# Patient Record
Sex: Female | Born: 1970 | Hispanic: Yes
Health system: Southern US, Academic
[De-identification: ages and names within clinical notes are randomized; demographics above are authoritative.]

## PROBLEM LIST (undated history)

## (undated) ENCOUNTER — Inpatient Hospital Stay (HOSPITAL_COMMUNITY): Payer: Medicaid Other

## (undated) ENCOUNTER — Encounter

## (undated) ENCOUNTER — Ambulatory Visit
Attending: Student in an Organized Health Care Education/Training Program | Primary: Student in an Organized Health Care Education/Training Program

## (undated) ENCOUNTER — Ambulatory Visit
Attending: Rehabilitative and Restorative Service Providers" | Primary: Rehabilitative and Restorative Service Providers"

## (undated) ENCOUNTER — Ambulatory Visit

## (undated) ENCOUNTER — Telehealth

## (undated) ENCOUNTER — Encounter
Attending: Student in an Organized Health Care Education/Training Program | Primary: Student in an Organized Health Care Education/Training Program

## (undated) ENCOUNTER — Ambulatory Visit: Attending: Rheumatology | Primary: Rheumatology

## (undated) ENCOUNTER — Encounter: Attending: Medical | Primary: Medical

## (undated) ENCOUNTER — Encounter: Attending: Ambulatory Care | Primary: Ambulatory Care

## (undated) ENCOUNTER — Encounter: Attending: Transplant Hepatology | Primary: Transplant Hepatology

## (undated) ENCOUNTER — Encounter: Attending: Rheumatology | Primary: Rheumatology

## (undated) ENCOUNTER — Ambulatory Visit: Attending: Retina Specialist | Primary: Retina Specialist

## (undated) ENCOUNTER — Ambulatory Visit: Attending: Ambulatory Care | Primary: Ambulatory Care

## (undated) ENCOUNTER — Telehealth
Attending: Student in an Organized Health Care Education/Training Program | Primary: Student in an Organized Health Care Education/Training Program

## (undated) ENCOUNTER — Encounter: Attending: Internal Medicine | Primary: Internal Medicine

## (undated) ENCOUNTER — Encounter: Attending: Dermatology | Primary: Dermatology

## (undated) ENCOUNTER — Telehealth: Attending: Rheumatology | Primary: Rheumatology

## (undated) ENCOUNTER — Telehealth: Attending: Ambulatory Care | Primary: Ambulatory Care

## (undated) ENCOUNTER — Ambulatory Visit: Attending: Obstetrics & Gynecology | Primary: Obstetrics & Gynecology

## (undated) ENCOUNTER — Ambulatory Visit: Attending: Dermatology | Primary: Dermatology

## (undated) DIAGNOSIS — F329 Major depressive disorder, single episode, unspecified: Secondary | ICD-10-CM

## (undated) DIAGNOSIS — A549 Gonococcal infection, unspecified: Secondary | ICD-10-CM

## (undated) DIAGNOSIS — N200 Calculus of kidney: Secondary | ICD-10-CM

## (undated) DIAGNOSIS — R002 Palpitations: Secondary | ICD-10-CM

## (undated) DIAGNOSIS — J45909 Unspecified asthma, uncomplicated: Secondary | ICD-10-CM

## (undated) DIAGNOSIS — L940 Localized scleroderma [morphea]: Secondary | ICD-10-CM

## (undated) DIAGNOSIS — D649 Anemia, unspecified: Secondary | ICD-10-CM

## (undated) DIAGNOSIS — F32A Depression, unspecified: Secondary | ICD-10-CM

## (undated) DIAGNOSIS — K649 Unspecified hemorrhoids: Secondary | ICD-10-CM

## (undated) DIAGNOSIS — B019 Varicella without complication: Secondary | ICD-10-CM

## (undated) DIAGNOSIS — R0789 Other chest pain: Secondary | ICD-10-CM

## (undated) HISTORY — DX: Palpitations: R00.2

## (undated) HISTORY — DX: Depression, unspecified: F32.A

## (undated) HISTORY — DX: Anemia, unspecified: D64.9

## (undated) HISTORY — DX: Other chest pain: R07.89

## (undated) HISTORY — DX: Varicella without complication: B01.9

## (undated) HISTORY — DX: Localized scleroderma (morphea): L94.0

## (undated) HISTORY — DX: Unspecified asthma, uncomplicated: J45.909

## (undated) HISTORY — DX: Major depressive disorder, single episode, unspecified: F32.9

## (undated) HISTORY — DX: Calculus of kidney: N20.0

## (undated) HISTORY — DX: Unspecified hemorrhoids: K64.9

## (undated) HISTORY — DX: Gonococcal infection, unspecified: A54.9

## (undated) MED ORDER — IBUPROFEN 800 MG TABLET: Freq: Three times a day (TID) | ORAL | 0.00000 days

---
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
## 2000-02-29 ENCOUNTER — Emergency Department (HOSPITAL_COMMUNITY): Admission: EM | Admit: 2000-02-29 | Discharge: 2000-02-29 | Payer: Self-pay | Admitting: *Deleted

## 2001-03-20 ENCOUNTER — Emergency Department (HOSPITAL_COMMUNITY): Admission: EM | Admit: 2001-03-20 | Discharge: 2001-03-20 | Payer: Self-pay | Admitting: Emergency Medicine

## 2003-03-12 ENCOUNTER — Encounter: Admission: RE | Admit: 2003-03-12 | Discharge: 2003-03-12 | Payer: Self-pay | Admitting: Obstetrics and Gynecology

## 2005-05-19 ENCOUNTER — Encounter (INDEPENDENT_AMBULATORY_CARE_PROVIDER_SITE_OTHER): Payer: Self-pay | Admitting: Specialist

## 2005-05-19 ENCOUNTER — Ambulatory Visit (HOSPITAL_COMMUNITY): Admission: RE | Admit: 2005-05-19 | Discharge: 2005-05-19 | Payer: Self-pay | Admitting: Obstetrics and Gynecology

## 2008-01-28 ENCOUNTER — Other Ambulatory Visit: Admission: RE | Admit: 2008-01-28 | Discharge: 2008-01-28 | Payer: Self-pay | Admitting: Gynecology

## 2010-07-10 ENCOUNTER — Encounter: Payer: Self-pay | Admitting: Family Medicine

## 2010-10-20 ENCOUNTER — Inpatient Hospital Stay (INDEPENDENT_AMBULATORY_CARE_PROVIDER_SITE_OTHER)
Admission: RE | Admit: 2010-10-20 | Discharge: 2010-10-20 | Disposition: A | Discharge status: Home / Self Care | Payer: Self-pay | Source: Ambulatory Visit | Attending: Emergency Medicine | Admitting: Emergency Medicine

## 2010-10-20 DIAGNOSIS — N920 Excessive and frequent menstruation with regular cycle: Secondary | ICD-10-CM

## 2010-10-20 LAB — POCT URINALYSIS DIP (DEVICE)
Glucose, UA: NEGATIVE mg/dL
Nitrite: NEGATIVE
Protein, ur: NEGATIVE mg/dL
Urobilinogen, UA: 0.2 mg/dL (ref 0.0–1.0)

## 2010-10-20 LAB — POCT PREGNANCY, URINE: Preg Test, Ur: NEGATIVE

## 2010-10-21 LAB — POCT I-STAT, CHEM 8
Calcium, Ion: 1.2 mmol/L (ref 1.12–1.32)
Creatinine, Ser: 0.8 mg/dL (ref 0.4–1.2)
Hemoglobin: 12.2 g/dL (ref 12.0–15.0)
Sodium: 141 mEq/L (ref 135–145)
TCO2: 27 mmol/L (ref 0–100)

## 2010-11-04 NOTE — H&P (Signed)
NAME:  Beverly Howe, Beverly Howe NO.:  [id number]   MEDICAL RECORD NO.:  [id number]          PATIENT TYPE:  AMB   LOCATION:  SDC                           FACILITY:  [facility]   PHYSICIAN:  Juluis Mire, M.D.   DATE OF BIRTH:  1970-12-27   DATE OF ADMISSION:  DATE OF DISCHARGE:                                HISTORY & PHYSICAL   A 40 year old gravida 1 para 1 married female presents for a hysteroscopy.   PRESENT ADMISSION:  The patient has a longstanding history of anovulatory  cycling and underwent a saline infusion ultrasound in September with the  finding of several large polypoid masses within the endometrial cavity. In  view of this, the patient now presents for hysteroscopic evaluation and  resection of endometrial polyps.   ALLERGIES:  She has no known drug allergies.   MEDICATIONS:  None.   PAST MEDICAL HISTORY:  Usual childhood diseases, no significant sequelae.   PAST SURGICAL HISTORY:  One prior cesarean section.   FAMILY HISTORY:  Noncontributory.   SOCIAL HISTORY:  Reveals no tobacco or alcohol use.   REVIEW OF SYSTEMS:  Noncontributory.   PHYSICAL EXAMINATION:  VITAL SIGNS:  The patient is afebrile with stable  vital signs.  HEENT:  The patient is normocephalic. Pupils equal, round, and reactive to  light and accommodation. Extraocular movements were intact. Sclerae and  conjunctivae clear, oropharynx clear.  NECK:  Without thyromegaly.  BREASTS:  No discrete masses.  LUNGS:  Clear.  CARDIOVASCULAR:  Regular rhythm and rate, no murmurs or gallops.  ABDOMEN:  Benign.  PELVIC:  Normal external genitalia, vaginal mucosa is clear. Cervix  unremarkable. Uterus normal size, shape, and contour. Adnexa free of masses  or tenderness.  EXTREMITIES:  Trace edema.  NEUROLOGIC:  Grossly within normal limits.   IMPRESSION:  Anovulatory bleeding with endometrial polyp.   PLAN:  The patient will undergo a hysteroscopic evaluation with resection of  polyp. The  risks of surgery have been discussed including the risks of  infection; the risk of hemorrhage that could require transfusion with the  risk of  AIDS or hepatitis; the risk of injury to adjacent organs including bladder,  bowel or ureters that could require further exploratory surgery; the risk of  deep venous thrombosis and pulmonary embolus. The patient professed an  understanding of the indications and risks.      Juluis Mire, M.D.  Electronically Signed     JSM/MEDQ  D:  05/19/2005  T:  05/19/2005  Job:  [id number]

## 2010-11-04 NOTE — H&P (Signed)
NAME:  Beverly Howe, Beverly Howe NO.:  [id number]   MEDICAL RECORD NO.:  [id number]          PATIENT TYPE:  AMB   LOCATION:  SDC                           [facility]   PHYSICIAN:  Juluis Mire, M.D.   DATE OF BIRTH:  10-14-1970   DATE OF ADMISSION:  04/07/2005  DATE OF DISCHARGE:                                HISTORY & PHYSICAL   A 40 year old gravida 1, para 1, female presents for hysteroscopy.   The patient presented to the office on September 15.  She has not had a  period for two years.  She had some lower abdominal discomfort.  Was  actually trying to conceive.  Subsequent saline infusion ultrasound revealed  significant endometrial build-up and polyps consistent with her anovulatory  status.  Ovaries were otherwise unremarkable.  She now presents for a  hysteroscopic evaluation of the endometrium and resection of polyps in order  to rule out any type of hyperplastic process.   ALLERGIES:  No known drug allergies.   MEDICATIONS:  None.   PAST MEDICAL HISTORY:  The usual childhood diseases, no significant  sequelae.   PREVIOUS SURGICAL HISTORY:  One cesarean section.   FAMILY HISTORY:  Noncontributory.   SOCIAL HISTORY:  No tobacco or alcohol use.   REVIEW OF SYSTEMS:  Noncontributory.   PHYSICAL EXAMINATION:  VITAL SIGNS:  The patient is afebrile with stable  vital signs.  HEENT:  Patient normocephalic.  Pupils equal, round, and reactive to light  and accommodation.  Extraocular movements were intact.  Sclerae and  conjunctivae were clear.  Oropharynx clear.  NECK:  Without thyromegaly.  BREASTS:  No discrete masses.  LUNGS:  Clear.  CARDIAC:  Regular rhythm and rate without murmurs.  ABDOMEN:  Benign.  No mass, organomegaly or tenderness.  PELVIC:  Normal external genitalia.  Vaginal mucosa is clear.  Cervix  unremarkable.  Uterus normal size, shape and contour.  Adnexa free of masses  or tenderness.  EXTREMITIES:  Trace edema.  NEUROLOGIC:   Grossly within normal limits.   IMPRESSION:  1.  Anovulatory cycling with associated oligo-amenorrhea.  2.  Endometrial polyp, rule out endometrial hyperplasia.   PLAN:  The patient will undergo hysteroscopic evaluation with resection of  polyps.  The risks of surgery have been explained through interpreter.  These risks include the risk of infection; risk of vascular injury that  could lead to hemorrhage requiring transfusion or possible hysterectomy;  risk of injury to adjacent organs through perforation that could require  exploratory surgery; the risk of deep venous thrombosis and pulmonary  embolus.  The patient expressed an understanding of the indications and  risks.      Juluis Mire, M.D.  Electronically Signed     JSM/MEDQ  D:  04/07/2005  T:  04/07/2005  Job:  [id number]

## 2010-11-04 NOTE — Op Note (Signed)
NAME:  Beverly Howe, Beverly Howe          ACCOUNT NO.:  [id number]   MEDICAL RECORD NO.:  [id number]          PATIENT TYPE:  AMB   LOCATION:  SDC                           [facility]   PHYSICIAN:  Juluis Mire, M.D.   DATE OF BIRTH:  13-Jun-1971   DATE OF PROCEDURE:  05/19/2005  DATE OF DISCHARGE:                                 OPERATIVE REPORT   PREOPERATIVE DIAGNOSIS:  Anovulatory bleeding with endometrial polyps.   POSTOPERATIVE DIAGNOSIS:  Anovulatory bleeding with endometrial polyps.   OPERATIVE PROCEDURE:  Cervical dilatation, hysteroscopy, resection of  polyps, endometrial curettings.   SURGEON:  Juluis Mire, M.D.   ANESTHESIA:  General.   ESTIMATED BLOOD LOSS:  Minimal.   PACKS AND DRAINS:  None.   INTRAOPERATIVE BLOOD REPLACED:  None.   COMPLICATIONS:  None.   INDICATIONS:  As dictated in the history and physical.   PROCEDURE:  The patient was taken into the OR, placed in the supine  position.  After a satisfactory level of general anesthesia obtained, the  patient was placed in the dorsal lithotomy position using the Allen  stirrups.  The perineum and vagina were cleansed out with Betadine and  draped as a sterile field.  A speculum was placed in the vaginal vault.  The  cervix was grasped with a single-tooth tenaculum.  The uterus sounded to 8  cm.  The cervix was serially dilated to a size 35 Pratt dilator.  Operative  hysteroscope was introduced , intrauterine cavity was distended using  sorbitol.  Visualization revealed three polyps, one on the right pelvic  sidewall and two on the posterior wall.  Each was resected, sent to  pathology for review.  Endometrial curettings were obtained.  There was no  sign of complications, no active bleeding or signs of perforation.  Total  deficit was 20 mL.  At this point in time the single-tooth tenaculum and  speculum were removed.  The patient was taken out of the dorsal lithotomy  position and once alert and  extubated transferred to the recovery room in  good condition.  Sponge, instrument and needle count reported as correct by  the circulating nurse x2.      Juluis Mire, M.D.  Electronically Signed    JSM/MEDQ  D:  05/19/2005  T:  05/19/2005  Job:  [id number]

## 2011-07-27 ENCOUNTER — Other Ambulatory Visit: Payer: Self-pay | Admitting: Family Medicine

## 2011-07-27 DIAGNOSIS — N6320 Unspecified lump in the left breast, unspecified quadrant: Secondary | ICD-10-CM

## 2011-08-07 ENCOUNTER — Ambulatory Visit: Payer: Self-pay

## 2011-08-22 ENCOUNTER — Ambulatory Visit (INDEPENDENT_AMBULATORY_CARE_PROVIDER_SITE_OTHER): Payer: Self-pay | Admitting: *Deleted

## 2011-08-22 VITALS — BP 112/80 | HR 74 | Temp 97.3°F | Ht <= 58 in | Wt 147.6 lb

## 2011-08-22 DIAGNOSIS — N6320 Unspecified lump in the left breast, unspecified quadrant: Secondary | ICD-10-CM

## 2011-08-22 DIAGNOSIS — Z1239 Encounter for other screening for malignant neoplasm of breast: Secondary | ICD-10-CM

## 2011-08-22 DIAGNOSIS — N63 Unspecified lump in unspecified breast: Secondary | ICD-10-CM

## 2011-08-22 NOTE — Patient Instructions (Addendum)
Taught patient how to perform BSE and gave educational materials to take home. Patient did not need a Pap smear today due to last Pap smear was 10/24/10. Let her know BCCCP will cover Pap smears every 3 years unless has a history of abnormal Pap smears. Patient is scheduled for a diagnostic mammogram and possible left breast ultrasound tomorrow Wednesday, August 23, 2011 at 0800. Patient aware of appointment and will be there. Let patient know will follow up with her within the next couple weeks with results. Patient verbalized understanding.

## 2011-08-22 NOTE — Progress Notes (Signed)
Complaints left breast lump and pain  Patient referred from the [facility] [employer]. [facility]ointment at [facility] was 07/27/11.  Pap Smear:    Pap smear not performed today. Patients last Pap smear was 10/24/10 at the [facility] and was normal. Per patient she has no history of abnormal Pap smears. No Pap smear results in EPIC.  Physical exam: Breasts Breasts symmetrical. No skin abnormalities bilateral breasts. No nipple retraction bilateral breasts. No nipple discharge bilateral breasts. No lymphadenopathy. No lumps palpated right breast. Palpated small lump in the left breast at 3 o'clock about 2 cm from the nipple. Patient complained of tenderness on palpation.  Patient referred to the [facility] Mammogram and left breast ultrasound Wednesday, August 23, 2011 at 0800.         Pelvic/Bimanual No Pap smear completed today since last Pap smear was 10/24/10 and normal. Pap smear not indicated per BCCCP guidelines.

## 2011-08-23 ENCOUNTER — Ambulatory Visit
Admission: RE | Admit: 2011-08-23 | Discharge: 2011-08-23 | Disposition: A | Discharge status: Home / Self Care | Payer: No Typology Code available for payment source | Source: Ambulatory Visit | Attending: Family Medicine | Admitting: Family Medicine

## 2011-08-23 DIAGNOSIS — N6320 Unspecified lump in the left breast, unspecified quadrant: Secondary | ICD-10-CM

## 2011-08-25 ENCOUNTER — Telehealth: Payer: Self-pay | Admitting: *Deleted

## 2011-08-25 NOTE — Telephone Encounter (Signed)
Telephoned patient at home # and advised pt diagnostic mammogram was normal. Advised pt would need screening mammogram at age 41. Pt voiced understanding.

## 2012-01-01 ENCOUNTER — Other Ambulatory Visit (HOSPITAL_COMMUNITY): Payer: Self-pay | Admitting: Family

## 2012-01-01 DIAGNOSIS — Z0489 Encounter for examination and observation for other specified reasons: Secondary | ICD-10-CM

## 2012-01-01 LAB — OB RESULTS CONSOLE ANTIBODY SCREEN: Antibody Screen: NEGATIVE

## 2012-01-01 LAB — OB RESULTS CONSOLE HEPATITIS B SURFACE ANTIGEN: Hepatitis B Surface Ag: NEGATIVE

## 2012-01-01 LAB — OB RESULTS CONSOLE RUBELLA ANTIBODY, IGM: Rubella: IMMUNE

## 2012-01-01 LAB — OB RESULTS CONSOLE RPR: RPR: NONREACTIVE

## 2012-01-01 LAB — OB RESULTS CONSOLE VARICELLA ZOSTER ANTIBODY, IGG: Varicella: IMMUNE

## 2012-01-11 ENCOUNTER — Other Ambulatory Visit (HOSPITAL_COMMUNITY): Payer: Self-pay | Admitting: Family

## 2012-01-11 ENCOUNTER — Ambulatory Visit (HOSPITAL_COMMUNITY)
Admission: RE | Admit: 2012-01-11 | Discharge: 2012-01-11 | Disposition: A | Discharge status: Home / Self Care | Payer: Medicaid Other | Source: Ambulatory Visit | Attending: Family | Admitting: Family

## 2012-01-11 DIAGNOSIS — Z363 Encounter for antenatal screening for malformations: Secondary | ICD-10-CM | POA: Insufficient documentation

## 2012-01-11 DIAGNOSIS — Z1389 Encounter for screening for other disorder: Secondary | ICD-10-CM | POA: Insufficient documentation

## 2012-01-11 DIAGNOSIS — O34219 Maternal care for unspecified type scar from previous cesarean delivery: Secondary | ICD-10-CM | POA: Insufficient documentation

## 2012-01-11 DIAGNOSIS — Z0489 Encounter for examination and observation for other specified reasons: Secondary | ICD-10-CM

## 2012-01-11 DIAGNOSIS — O09529 Supervision of elderly multigravida, unspecified trimester: Secondary | ICD-10-CM | POA: Insufficient documentation

## 2012-01-11 DIAGNOSIS — O358XX0 Maternal care for other (suspected) fetal abnormality and damage, not applicable or unspecified: Secondary | ICD-10-CM | POA: Insufficient documentation

## 2012-01-29 ENCOUNTER — Other Ambulatory Visit (HOSPITAL_COMMUNITY): Payer: Self-pay | Admitting: Physician Assistant

## 2012-01-29 DIAGNOSIS — Z3689 Encounter for other specified antenatal screening: Secondary | ICD-10-CM

## 2012-02-05 ENCOUNTER — Encounter: Payer: Self-pay | Admitting: *Deleted

## 2012-02-05 ENCOUNTER — Ambulatory Visit (INDEPENDENT_AMBULATORY_CARE_PROVIDER_SITE_OTHER): Payer: Medicaid Other | Admitting: Internal Medicine

## 2012-02-05 ENCOUNTER — Encounter: Payer: Self-pay | Admitting: Internal Medicine

## 2012-02-05 VITALS — BP 80/60 | HR 79 | Ht <= 58 in | Wt 153.0 lb

## 2012-02-05 DIAGNOSIS — R002 Palpitations: Secondary | ICD-10-CM

## 2012-02-05 LAB — TSH: TSH: 0.85 u[IU]/mL (ref 0.35–5.50)

## 2012-02-05 NOTE — Progress Notes (Signed)
HPI Patient is a 41 year old who was referred for evaluaton of palpitations.   Patient is currently [redacted] wks pregnant in her 2nd pregnancy (first pregnancy was 20 years ago--no problems) SHe says about [redacted] wks gestation she began to notice her heart would pound.  Not associated with dizziness.  Lasts about 1 min.  After that she feels a tightness in chest, is a little SOB.  Lasts also about 1 minute  Can occur multiple times per day. When she is not having pounding she denies CP  No SOB  Keeps up with activities.  Cares for granddaughter.  Allergies  Allergen Reactions  . Caffeine Shortness Of Breath    Current Outpatient Prescriptions  Medication Sig Dispense Refill  . Prenatal Vit-Fe Fumarate-FA (MULTIVITAMIN-PRENATAL) 27-0.8 MG TABS Take 1 tablet by mouth daily.        Past Medical History  Diagnosis Date  . Hemorrhoids   . Heart palpitations   . Anemia   . Asthma   . Kidney stones   . Depression   . Gonorrhea   . Varicella infection     in first pregnancy  . Morphea   . Chest tightness     No past surgical history on file.  Family History  Problem Relation Age of Onset  . Down syndrome Brother   . Hypertension Daughter   . Cancer Mother     Uterine     History   Social History  . Marital Status: Married    Spouse Name: N/A    Number of Children: N/A  . Years of Education: N/A   Occupational History  . Not on file.   Social History Main Topics  . Smoking status: Never Smoker   . Smokeless tobacco: Not on file  . Alcohol Use: Not on file  . Drug Use: Not on file  . Sexually Active: Not on file   Other Topics Concern  . Not on file   Social History Narrative  . No narrative on file    Review of Systems:  All systems reviewed.  They are negative to the above problem except as previously stated.  Vital Signs: BP 80/60  Pulse 79  Ht 4\' 10"  (1.473 m)  Wt 153 lb (69.4 kg)  BMI 31.98 kg/m2  Physical Exam Patient is in NAD HEENT:  Normocephalic,  atraumatic. EOMI, PERRLA.  Neck: JVP is normal.  No bruits.  Lungs: clear to auscultation. No rales no wheezes.  Heart: Regular rate and rhythm. Normal S1, S2. No S3.   No significant murmurs. PMI not displaced.  Abdomen:  Supple, nontender. Normal bowel sounds. No masses. No hepatomegaly.  Extremities:   Good distal pulses throughout. No lower extremity edema.  Musculoskeletal :moving all extremities.  Neuro:   alert and oriented x3.  CN II-XII grossly intact.  EKG:  SR 79 bpm.   Assessment and Plan:  1.  Palpitations.  I am not sure what these represent.  THey are associated with chest pressure after they occur. I am not sure what they represent.  They do not sound hemodyn destabilizing. I would recomm checking TSH and setting up for an event montior.  Avoid stimulants.  Stay hydrated.  2.  Chest pressure.  I am not convinced ischemic  Exam is normal  She does not have symptoms when she is active.

## 2012-02-05 NOTE — Patient Instructions (Signed)
Lab work today We will call you with results.  Your physician has recommended that you wear an event monitor. Event monitors are medical devices that record the heart's electrical activity. Doctors most often [place] these monitors to diagnose arrhythmias. Arrhythmias are problems with the speed or rhythm of the heartbeat. The monitor is a small, portable device. You can wear one while you do your normal daily activities. This is usually used to diagnose what is causing palpitations/syncope (passing out).

## 2012-02-09 ENCOUNTER — Ambulatory Visit (HOSPITAL_COMMUNITY): Payer: Self-pay

## 2012-02-13 ENCOUNTER — Ambulatory Visit (HOSPITAL_COMMUNITY)
Admission: RE | Admit: 2012-02-13 | Discharge: 2012-02-13 | Disposition: A | Discharge status: Home / Self Care | Payer: Medicaid Other | Source: Ambulatory Visit | Attending: Physician Assistant | Admitting: Physician Assistant

## 2012-02-13 DIAGNOSIS — O34219 Maternal care for unspecified type scar from previous cesarean delivery: Secondary | ICD-10-CM | POA: Insufficient documentation

## 2012-02-13 DIAGNOSIS — Z1389 Encounter for screening for other disorder: Secondary | ICD-10-CM | POA: Insufficient documentation

## 2012-02-13 DIAGNOSIS — Z3689 Encounter for other specified antenatal screening: Secondary | ICD-10-CM

## 2012-02-13 DIAGNOSIS — Z363 Encounter for antenatal screening for malformations: Secondary | ICD-10-CM | POA: Insufficient documentation

## 2012-02-13 DIAGNOSIS — O09529 Supervision of elderly multigravida, unspecified trimester: Secondary | ICD-10-CM | POA: Insufficient documentation

## 2012-02-13 DIAGNOSIS — O358XX0 Maternal care for other (suspected) fetal abnormality and damage, not applicable or unspecified: Secondary | ICD-10-CM | POA: Insufficient documentation

## 2012-02-29 ENCOUNTER — Encounter: Payer: Self-pay | Admitting: *Deleted

## 2012-04-15 LAB — OB RESULTS CONSOLE RPR: RPR: NONREACTIVE

## 2012-05-03 ENCOUNTER — Encounter: Payer: Self-pay | Admitting: *Deleted

## 2012-05-03 ENCOUNTER — Telehealth: Payer: Self-pay | Admitting: *Deleted

## 2012-05-03 NOTE — Telephone Encounter (Signed)
Beverly Howe was seen by Dr. Tenny Craw 02/05/12. During her office visit , Dr. Tenny Craw order 30 day event monitor. Windell Moulding tired calling the patient 02/27/12. Patient phone disconnected. I attempted to call the patient on 05/03/12 , but the patient phones are disconnected. I will sent a letter to the patient home.

## 2012-05-06 NOTE — Telephone Encounter (Signed)
I sent a letter in November with no response.

## 2012-06-05 LAB — OB RESULTS CONSOLE GBS: GBS: POSITIVE

## 2012-06-05 LAB — OB RESULTS CONSOLE GC/CHLAMYDIA: Chlamydia: NEGATIVE

## 2012-06-29 ENCOUNTER — Encounter (HOSPITAL_COMMUNITY): Payer: Self-pay | Admitting: *Deleted

## 2012-06-29 ENCOUNTER — Inpatient Hospital Stay (HOSPITAL_COMMUNITY)
Admission: AD | Admit: 2012-06-29 | Discharge: 2012-06-29 | Disposition: A | Discharge status: Home / Self Care | Payer: Medicaid Other | Source: Ambulatory Visit | Attending: Obstetrics & Gynecology | Admitting: Obstetrics & Gynecology

## 2012-06-29 DIAGNOSIS — O479 False labor, unspecified: Secondary | ICD-10-CM

## 2012-06-29 DIAGNOSIS — O34219 Maternal care for unspecified type scar from previous cesarean delivery: Secondary | ICD-10-CM | POA: Insufficient documentation

## 2012-06-29 DIAGNOSIS — O99891 Other specified diseases and conditions complicating pregnancy: Secondary | ICD-10-CM | POA: Insufficient documentation

## 2012-06-29 DIAGNOSIS — Z22330 Carrier of Group B streptococcus: Secondary | ICD-10-CM | POA: Insufficient documentation

## 2012-06-29 NOTE — MAU Provider Note (Signed)
  History     CSN: [id number]  Arrival date and time: 06/29/12 0759   None     Chief Complaint  Patient presents with  . Labor Eval   HPI Beverly Howe is a 42 y.o. G43P1001 female at [redacted]w[redacted]d who present w/ report of uc's q that began at 0200, haven't really increased in frequency or intensity. Denies lof, vb, or abdominal pain other than uc's.  Reports good fm. Previous c/s 28yrs ago in [place] d/t arrest of dilation @ 5cm, desires VBAC- consent signed. Initiated regular pnc @ [facility] @ 16.5wks.   OB History    Grav Para Term Preterm Abortions TAB SAB Ect Mult Living   2 1 1       1       Past Medical History  Diagnosis Date  . Depression     Past Surgical History  Procedure Date  . Cesarean section     Family History  Problem Relation Age of Onset  . Hypertension Mother   . Cancer Mother     uterine and colon  . Diabetes Maternal Aunt     History  Substance Use Topics  . Smoking status: Never Smoker   . Smokeless tobacco: Never Used  . Alcohol Use: Yes     Comment: socially    Allergies: No Known Allergies  Prescriptions prior to admission  Medication Sig Dispense Refill  . Prenatal Vit-Fe Fumarate-FA (PRENATAL MULTIVITAMIN) TABS Take 1 tablet by mouth every morning.        Review of Systems  Constitutional: Negative.   HENT: Negative.   Eyes: Negative.   Respiratory: Negative.   Cardiovascular: Negative.   Gastrointestinal: Negative.  Abdominal pain: uc's only.  Genitourinary: Negative.   Musculoskeletal: Negative.   Skin: Negative.   Neurological: Negative.   Endo/Heme/Allergies: Negative.   Psychiatric/Behavioral: Negative.    Physical Exam   Last menstrual period 08/08/2011.  Physical Exam  Constitutional: She is oriented to person, place, and time. She appears well-developed and well-nourished.  HENT:  Head: Normocephalic.  Neck: Normal range of motion.  Cardiovascular: Normal rate.   Respiratory: Effort normal.  GI: Soft.  There is no tenderness.       gravid  Musculoskeletal: Normal range of motion.  Neurological: She is alert and oriented to person, place, and time.  Skin: Skin is warm and dry.  Psychiatric: She has a normal mood and affect. Her behavior is normal. Judgment and thought content normal.   FHR: 135, mod variability, 15x15accels, no decels=Cat I UCs: mild, irregular, q 2-9  SVE by RN: 1/80/-3 x 2 1hr apart MAU Course  Procedures NST SVE by RN x 2  Assessment and Plan  A:  [redacted]w[redacted]d SIUP  Braxton hicks vs. early latent phase labor  Cat I FHR  Prev c/s- desires vbac  GBS pos  P:  D/C home  Reviewed reasons to return (labor, srom, vb, decreased fm, etc)  Keep next appt as scheduled at [facility] 06/29/2012, 9:17 AM

## 2012-06-29 NOTE — MAU Note (Signed)
Pt reports having ctx since 2am about q 10 min now. Denies SROM or bleeding at this time and reports good fetal movement

## 2012-06-29 NOTE — Progress Notes (Signed)
Beverly Howe notified of no changed in pt's cervical exam, FHR pattern and contraction pattern, orders received

## 2012-06-29 NOTE — MAU Provider Note (Signed)
Attestation of Attending Supervision of Advanced Practitioner (PA/CNM/NP): Evaluation and management procedures were performed by the Advanced Practitioner under my supervision and collaboration.  I have reviewed the Advanced Practitioner's note and chart, and I agree with the management and plan.  Khalib Fendley, MD, FACOG Attending Obstetrician & Gynecologist Faculty Practice, [facility]  

## 2012-06-30 ENCOUNTER — Encounter (HOSPITAL_COMMUNITY): Payer: Self-pay | Admitting: *Deleted

## 2012-06-30 ENCOUNTER — Inpatient Hospital Stay (HOSPITAL_COMMUNITY)
Admission: AD | Admit: 2012-06-30 | Discharge: 2012-07-02 | DRG: 766 | Disposition: A | Discharge status: Home / Self Care | Payer: Medicaid Other | Source: Ambulatory Visit | Attending: Obstetrics & Gynecology | Admitting: Obstetrics & Gynecology

## 2012-06-30 ENCOUNTER — Encounter (HOSPITAL_COMMUNITY)
Admission: AD | Disposition: A | Discharge status: Home / Self Care | Payer: Self-pay | Source: Ambulatory Visit | Attending: Obstetrics & Gynecology

## 2012-06-30 ENCOUNTER — Encounter (HOSPITAL_COMMUNITY): Payer: Self-pay | Admitting: Anesthesiology

## 2012-06-30 ENCOUNTER — Inpatient Hospital Stay (HOSPITAL_COMMUNITY): Payer: Medicaid Other | Admitting: Anesthesiology

## 2012-06-30 DIAGNOSIS — Z98891 History of uterine scar from previous surgery: Secondary | ICD-10-CM

## 2012-06-30 DIAGNOSIS — O99892 Other specified diseases and conditions complicating childbirth: Secondary | ICD-10-CM | POA: Diagnosis present

## 2012-06-30 DIAGNOSIS — O324XX0 Maternal care for high head at term, not applicable or unspecified: Secondary | ICD-10-CM

## 2012-06-30 DIAGNOSIS — Z22330 Carrier of Group B streptococcus: Secondary | ICD-10-CM

## 2012-06-30 DIAGNOSIS — O34219 Maternal care for unspecified type scar from previous cesarean delivery: Secondary | ICD-10-CM | POA: Diagnosis present

## 2012-06-30 DIAGNOSIS — O09529 Supervision of elderly multigravida, unspecified trimester: Secondary | ICD-10-CM | POA: Diagnosis present

## 2012-06-30 LAB — CBC
HCT: 33.6 % — ABNORMAL LOW (ref 36.0–46.0)
Hemoglobin: 11.1 g/dL — ABNORMAL LOW (ref 12.0–15.0)
MCH: 25.6 pg — ABNORMAL LOW (ref 26.0–34.0)
MCHC: 33 g/dL (ref 30.0–36.0)

## 2012-06-30 SURGERY — Surgical Case
Anesthesia: Epidural | Site: Abdomen | Wound class: Clean Contaminated

## 2012-06-30 MED ORDER — FLEET ENEMA 7-19 GM/118ML RE ENEM: 1.0000 | ENEMA | RECTAL | Status: DC | PRN

## 2012-06-30 MED ORDER — DIPHENHYDRAMINE HCL 50 MG/ML IJ SOLN: 25.0000 mg | INTRAMUSCULAR | Status: DC | PRN

## 2012-06-30 MED ORDER — CITRIC ACID-SODIUM CITRATE 334-500 MG/5ML PO SOLN
30.0000 mL | ORAL | Status: DC | PRN
  Administered 2012-06-30: 30 mL via ORAL
  Filled 2012-06-30: qty 15

## 2012-06-30 MED ORDER — KETOROLAC TROMETHAMINE 30 MG/ML IJ SOLN: 30.0000 mg | Freq: Four times a day (QID) | INTRAMUSCULAR | Status: AC | PRN

## 2012-06-30 MED ORDER — IBUPROFEN 600 MG PO TABS: 600.0000 mg | ORAL_TABLET | Freq: Four times a day (QID) | ORAL | Status: DC | PRN

## 2012-06-30 MED ORDER — DIPHENHYDRAMINE HCL 50 MG/ML IJ SOLN: 12.5000 mg | INTRAMUSCULAR | Status: DC | PRN

## 2012-06-30 MED ORDER — NALBUPHINE SYRINGE 5 MG/0.5 ML
5.0000 mg | INJECTION | INTRAMUSCULAR | Status: DC | PRN
  Administered 2012-07-01 (×2): 10 mg via SUBCUTANEOUS
  Filled 2012-06-30 (×2): qty 1

## 2012-06-30 MED ORDER — ONDANSETRON HCL 4 MG/2ML IJ SOLN: 4.0000 mg | Freq: Four times a day (QID) | INTRAMUSCULAR | Status: DC | PRN

## 2012-06-30 MED ORDER — MEPERIDINE HCL 25 MG/ML IJ SOLN
INTRAMUSCULAR | Status: DC | PRN
  Administered 2012-06-30: 7 mg via INTRAVENOUS
  Administered 2012-06-30 (×3): 6 mg via INTRAVENOUS

## 2012-06-30 MED ORDER — SENNOSIDES-DOCUSATE SODIUM 8.6-50 MG PO TABS
2.0000 | ORAL_TABLET | Freq: Every day | ORAL | Status: DC
  Administered 2012-07-01: 2 via ORAL

## 2012-06-30 MED ORDER — LANOLIN HYDROUS EX OINT: 1.0000 "application " | TOPICAL_OINTMENT | CUTANEOUS | Status: DC | PRN

## 2012-06-30 MED ORDER — PHENYLEPHRINE HCL 10 MG/ML IJ SOLN
INTRAMUSCULAR | Status: DC | PRN
  Administered 2012-06-30: 80 ug via INTRAVENOUS

## 2012-06-30 MED ORDER — LIDOCAINE HCL (PF) 1 % IJ SOLN: 30.0000 mL | INTRAMUSCULAR | Status: DC | PRN

## 2012-06-30 MED ORDER — ZOLPIDEM TARTRATE 5 MG PO TABS: 5.0000 mg | ORAL_TABLET | Freq: Every evening | ORAL | Status: DC | PRN

## 2012-06-30 MED ORDER — ONDANSETRON HCL 4 MG/2ML IJ SOLN: 4.0000 mg | Freq: Three times a day (TID) | INTRAMUSCULAR | Status: DC | PRN

## 2012-06-30 MED ORDER — SODIUM CHLORIDE 0.9 % IJ SOLN: 3.0000 mL | INTRAMUSCULAR | Status: DC | PRN

## 2012-06-30 MED ORDER — MENTHOL 3 MG MT LOZG: 1.0000 | LOZENGE | OROMUCOSAL | Status: DC | PRN

## 2012-06-30 MED ORDER — FENTANYL CITRATE 0.05 MG/ML IJ SOLN
100.0000 ug | INTRAMUSCULAR | Status: DC | PRN
  Administered 2012-06-30: 100 ug via INTRAVENOUS
  Filled 2012-06-30: qty 2

## 2012-06-30 MED ORDER — OXYCODONE-ACETAMINOPHEN 5-325 MG PO TABS
1.0000 | ORAL_TABLET | ORAL | Status: DC | PRN
  Administered 2012-07-02: 1 via ORAL
  Filled 2012-06-30: qty 1

## 2012-06-30 MED ORDER — OXYTOCIN 10 UNIT/ML IJ SOLN
40.0000 [IU] | INTRAVENOUS | Status: DC | PRN
  Administered 2012-06-30: 40 [IU] via INTRAVENOUS

## 2012-06-30 MED ORDER — PENICILLIN G POTASSIUM 5000000 UNITS IJ SOLR
2.5000 10*6.[IU] | INTRAMUSCULAR | Status: DC
  Administered 2012-06-30 (×2): 2.5 10*6.[IU] via INTRAVENOUS
  Filled 2012-06-30 (×5): qty 2.5

## 2012-06-30 MED ORDER — SIMETHICONE 80 MG PO CHEW: 80.0000 mg | CHEWABLE_TABLET | ORAL | Status: DC | PRN

## 2012-06-30 MED ORDER — OXYTOCIN 40 UNITS IN LACTATED RINGERS INFUSION - SIMPLE MED
62.5000 mL/h | INTRAVENOUS | Status: DC
  Filled 2012-06-30: qty 1000

## 2012-06-30 MED ORDER — LACTATED RINGERS IV SOLN
INTRAVENOUS | Status: DC
  Administered 2012-06-30: 16:00:00 via INTRAUTERINE

## 2012-06-30 MED ORDER — SCOPOLAMINE 1 MG/3DAYS TD PT72
1.0000 | MEDICATED_PATCH | Freq: Once | TRANSDERMAL | Status: AC
  Administered 2012-06-30: 1.5 mg
  Filled 2012-06-30: qty 1

## 2012-06-30 MED ORDER — EPHEDRINE 5 MG/ML INJ
10.0000 mg | INTRAVENOUS | Status: DC | PRN
  Filled 2012-06-30: qty 4

## 2012-06-30 MED ORDER — SODIUM BICARBONATE 8.4 % IV SOLN
INTRAVENOUS | Status: DC | PRN
  Administered 2012-06-30 (×2): 5 mL via EPIDURAL

## 2012-06-30 MED ORDER — PHENYLEPHRINE 40 MCG/ML (10ML) SYRINGE FOR IV PUSH (FOR BLOOD PRESSURE SUPPORT)
PREFILLED_SYRINGE | INTRAVENOUS | Status: AC
  Filled 2012-06-30: qty 5

## 2012-06-30 MED ORDER — ONDANSETRON HCL 4 MG PO TABS: 4.0000 mg | ORAL_TABLET | ORAL | Status: DC | PRN

## 2012-06-30 MED ORDER — TETANUS-DIPHTH-ACELL PERTUSSIS 5-2.5-18.5 LF-MCG/0.5 IM SUSP: 0.5000 mL | Freq: Once | INTRAMUSCULAR | Status: DC

## 2012-06-30 MED ORDER — OXYTOCIN BOLUS FROM INFUSION: 500.0000 mL | INTRAVENOUS | Status: DC

## 2012-06-30 MED ORDER — IBUPROFEN 600 MG PO TABS
600.0000 mg | ORAL_TABLET | Freq: Four times a day (QID) | ORAL | Status: DC
  Administered 2012-07-01 – 2012-07-02 (×5): 600 mg via ORAL
  Filled 2012-06-30 (×6): qty 1

## 2012-06-30 MED ORDER — FENTANYL CITRATE 0.05 MG/ML IJ SOLN: 25.0000 ug | INTRAMUSCULAR | Status: DC | PRN

## 2012-06-30 MED ORDER — METOCLOPRAMIDE HCL 5 MG/ML IJ SOLN: 10.0000 mg | Freq: Three times a day (TID) | INTRAMUSCULAR | Status: DC | PRN

## 2012-06-30 MED ORDER — SIMETHICONE 80 MG PO CHEW
80.0000 mg | CHEWABLE_TABLET | Freq: Three times a day (TID) | ORAL | Status: DC
  Administered 2012-07-01 – 2012-07-02 (×4): 80 mg via ORAL

## 2012-06-30 MED ORDER — OXYCODONE-ACETAMINOPHEN 5-325 MG PO TABS: 1.0000 | ORAL_TABLET | ORAL | Status: DC | PRN

## 2012-06-30 MED ORDER — TERBUTALINE SULFATE 1 MG/ML IJ SOLN: 0.2500 mg | Freq: Once | INTRAMUSCULAR | Status: DC | PRN

## 2012-06-30 MED ORDER — NALBUPHINE SYRINGE 5 MG/0.5 ML
5.0000 mg | INJECTION | INTRAMUSCULAR | Status: DC | PRN
  Filled 2012-06-30 (×2): qty 1

## 2012-06-30 MED ORDER — FENTANYL 2.5 MCG/ML BUPIVACAINE 1/10 % EPIDURAL INFUSION (WH - ANES)
14.0000 mL/h | INTRAMUSCULAR | Status: DC
  Administered 2012-06-30 (×2): 14 mL/h via EPIDURAL
  Filled 2012-06-30 (×2): qty 125

## 2012-06-30 MED ORDER — OXYTOCIN 40 UNITS IN LACTATED RINGERS INFUSION - SIMPLE MED
62.5000 mL/h | INTRAVENOUS | Status: AC
  Filled 2012-06-30: qty 1000

## 2012-06-30 MED ORDER — LACTATED RINGERS IV SOLN
500.0000 mL | Freq: Once | INTRAVENOUS | Status: AC
  Administered 2012-06-30: 10:00:00 via INTRAVENOUS

## 2012-06-30 MED ORDER — LACTATED RINGERS IV SOLN: INTRAVENOUS | Status: DC

## 2012-06-30 MED ORDER — NALOXONE HCL 0.4 MG/ML IJ SOLN: 0.4000 mg | INTRAMUSCULAR | Status: DC | PRN

## 2012-06-30 MED ORDER — OXYTOCIN 10 UNIT/ML IJ SOLN
INTRAMUSCULAR | Status: AC
  Filled 2012-06-30: qty 4

## 2012-06-30 MED ORDER — LACTATED RINGERS IV SOLN
INTRAVENOUS | Status: DC
  Administered 2012-06-30 (×4): via INTRAVENOUS

## 2012-06-30 MED ORDER — PHENYLEPHRINE 40 MCG/ML (10ML) SYRINGE FOR IV PUSH (FOR BLOOD PRESSURE SUPPORT): 80.0000 ug | PREFILLED_SYRINGE | INTRAVENOUS | Status: DC | PRN

## 2012-06-30 MED ORDER — SCOPOLAMINE 1 MG/3DAYS TD PT72
MEDICATED_PATCH | TRANSDERMAL | Status: AC
  Administered 2012-06-30: 1.5 mg
  Filled 2012-06-30: qty 1

## 2012-06-30 MED ORDER — KETOROLAC TROMETHAMINE 30 MG/ML IJ SOLN
30.0000 mg | Freq: Four times a day (QID) | INTRAMUSCULAR | Status: AC | PRN
  Administered 2012-06-30: 30 mg via INTRAVENOUS

## 2012-06-30 MED ORDER — WITCH HAZEL-GLYCERIN EX PADS: 1.0000 "application " | MEDICATED_PAD | CUTANEOUS | Status: DC | PRN

## 2012-06-30 MED ORDER — ONDANSETRON HCL 4 MG/2ML IJ SOLN
INTRAMUSCULAR | Status: AC
  Filled 2012-06-30: qty 2

## 2012-06-30 MED ORDER — CEFAZOLIN SODIUM-DEXTROSE 2-3 GM-% IV SOLR
2.0000 g | INTRAVENOUS | Status: AC
  Administered 2012-06-30: 2 g via INTRAVENOUS
  Filled 2012-06-30: qty 50

## 2012-06-30 MED ORDER — LACTATED RINGERS IV SOLN
500.0000 mL | INTRAVENOUS | Status: DC | PRN
  Administered 2012-06-30: 500 mL via INTRAVENOUS

## 2012-06-30 MED ORDER — ACETAMINOPHEN 325 MG PO TABS: 650.0000 mg | ORAL_TABLET | ORAL | Status: DC | PRN

## 2012-06-30 MED ORDER — MORPHINE SULFATE 0.5 MG/ML IJ SOLN
INTRAMUSCULAR | Status: AC
  Filled 2012-06-30: qty 10

## 2012-06-30 MED ORDER — PRENATAL MULTIVITAMIN CH
1.0000 | ORAL_TABLET | Freq: Every day | ORAL | Status: DC
  Administered 2012-07-01 – 2012-07-02 (×2): 1 via ORAL
  Filled 2012-06-30 (×2): qty 1

## 2012-06-30 MED ORDER — PHENYLEPHRINE 40 MCG/ML (10ML) SYRINGE FOR IV PUSH (FOR BLOOD PRESSURE SUPPORT)
80.0000 ug | PREFILLED_SYRINGE | INTRAVENOUS | Status: DC | PRN
  Filled 2012-06-30: qty 5

## 2012-06-30 MED ORDER — ONDANSETRON HCL 4 MG/2ML IJ SOLN
INTRAMUSCULAR | Status: DC | PRN
  Administered 2012-06-30: 4 mg via INTRAVENOUS

## 2012-06-30 MED ORDER — LACTATED RINGERS IV SOLN
INTRAVENOUS | Status: DC | PRN
  Administered 2012-06-30 (×2): via INTRAVENOUS

## 2012-06-30 MED ORDER — OXYTOCIN 40 UNITS IN LACTATED RINGERS INFUSION - SIMPLE MED
1.0000 m[IU]/min | INTRAVENOUS | Status: DC
  Administered 2012-06-30: 1 m[IU]/min via INTRAVENOUS

## 2012-06-30 MED ORDER — FENTANYL CITRATE 0.05 MG/ML IJ SOLN
INTRAMUSCULAR | Status: DC | PRN
  Administered 2012-06-30 (×4): 25 ug via INTRAVENOUS

## 2012-06-30 MED ORDER — KETOROLAC TROMETHAMINE 30 MG/ML IJ SOLN
INTRAMUSCULAR | Status: AC
  Administered 2012-06-30: 30 mg via INTRAVENOUS
  Filled 2012-06-30: qty 1

## 2012-06-30 MED ORDER — DIPHENHYDRAMINE HCL 25 MG PO CAPS: 25.0000 mg | ORAL_CAPSULE | Freq: Four times a day (QID) | ORAL | Status: DC | PRN

## 2012-06-30 MED ORDER — NALOXONE HCL 1 MG/ML IJ SOLN
1.0000 ug/kg/h | INTRAVENOUS | Status: DC | PRN
  Filled 2012-06-30: qty 2

## 2012-06-30 MED ORDER — MORPHINE SULFATE (PF) 0.5 MG/ML IJ SOLN
INTRAMUSCULAR | Status: DC | PRN
  Administered 2012-06-30: 1 mg via INTRAVENOUS

## 2012-06-30 MED ORDER — MORPHINE SULFATE (PF) 0.5 MG/ML IJ SOLN
INTRAMUSCULAR | Status: DC | PRN
  Administered 2012-06-30: 4 mg via EPIDURAL

## 2012-06-30 MED ORDER — EPHEDRINE 5 MG/ML INJ: 10.0000 mg | INTRAVENOUS | Status: DC | PRN

## 2012-06-30 MED ORDER — SODIUM CHLORIDE 0.9 % IR SOLN
Status: DC | PRN
  Administered 2012-06-30: 1000 mL

## 2012-06-30 MED ORDER — CEFAZOLIN SODIUM-DEXTROSE 2-3 GM-% IV SOLR
INTRAVENOUS | Status: AC
  Filled 2012-06-30: qty 50

## 2012-06-30 MED ORDER — LIDOCAINE-EPINEPHRINE (PF) 2 %-1:200000 IJ SOLN
INTRAMUSCULAR | Status: AC
  Filled 2012-06-30: qty 20

## 2012-06-30 MED ORDER — OXYTOCIN 40 UNITS IN LACTATED RINGERS INFUSION - SIMPLE MED: 1.0000 m[IU]/min | INTRAVENOUS | Status: DC

## 2012-06-30 MED ORDER — SODIUM BICARBONATE 8.4 % IV SOLN
INTRAVENOUS | Status: AC
  Filled 2012-06-30: qty 50

## 2012-06-30 MED ORDER — ONDANSETRON HCL 4 MG/2ML IJ SOLN: 4.0000 mg | INTRAMUSCULAR | Status: DC | PRN

## 2012-06-30 MED ORDER — DIBUCAINE 1 % RE OINT: 1.0000 "application " | TOPICAL_OINTMENT | RECTAL | Status: DC | PRN

## 2012-06-30 MED ORDER — LACTATED RINGERS IV SOLN
INTRAVENOUS | Status: DC | PRN
  Administered 2012-06-30: 19:00:00 via INTRAVENOUS

## 2012-06-30 MED ORDER — DIPHENHYDRAMINE HCL 25 MG PO CAPS: 25.0000 mg | ORAL_CAPSULE | ORAL | Status: DC | PRN

## 2012-06-30 MED ORDER — LIDOCAINE HCL (PF) 1 % IJ SOLN
INTRAMUSCULAR | Status: DC | PRN
  Administered 2012-06-30 (×2): 5 mL

## 2012-06-30 MED ORDER — DEXTROSE 5 % IV SOLN
5.0000 10*6.[IU] | Freq: Once | INTRAVENOUS | Status: AC
  Administered 2012-06-30: 5 10*6.[IU] via INTRAVENOUS
  Filled 2012-06-30: qty 5

## 2012-06-30 MED ORDER — MEPERIDINE HCL 25 MG/ML IJ SOLN
INTRAMUSCULAR | Status: AC
  Filled 2012-06-30: qty 1

## 2012-06-30 MED ORDER — MEPERIDINE HCL 25 MG/ML IJ SOLN: 6.2500 mg | INTRAMUSCULAR | Status: DC | PRN

## 2012-06-30 MED ORDER — FENTANYL CITRATE 0.05 MG/ML IJ SOLN
INTRAMUSCULAR | Status: AC
  Filled 2012-06-30: qty 2

## 2012-06-30 SURGICAL SUPPLY — 35 items
APL SKNCLS STERI-STRIP NONHPOA (GAUZE/BANDAGES/DRESSINGS) ×1
BARRIER ADHS 3X4 INTERCEED (GAUZE/BANDAGES/DRESSINGS) IMPLANT
BENZOIN TINCTURE PRP APPL 2/3 (GAUZE/BANDAGES/DRESSINGS) ×1 IMPLANT
BRR ADH 4X3 ABS CNTRL BYND (GAUZE/BANDAGES/DRESSINGS)
CLOTH BEACON ORANGE TIMEOUT ST (SAFETY) ×2 IMPLANT
DRAPE LG THREE QUARTER DISP (DRAPES) ×2 IMPLANT
DRSG OPSITE POSTOP 4X10 (GAUZE/BANDAGES/DRESSINGS) ×2 IMPLANT
DURAPREP 26ML APPLICATOR (WOUND CARE) ×2 IMPLANT
ELECT REM PT RETURN 9FT ADLT (ELECTROSURGICAL) ×2
ELECTRODE REM PT RTRN 9FT ADLT (ELECTROSURGICAL) ×1 IMPLANT
EXTRACTOR VACUUM KIWI (MISCELLANEOUS) IMPLANT
GLOVE BIO SURGEON STRL SZ 6.5 (GLOVE) ×2 IMPLANT
GLOVE BIOGEL PI IND STRL 7.0 (GLOVE) ×2 IMPLANT
GLOVE BIOGEL PI INDICATOR 7.0 (GLOVE) ×3
GOWN PREVENTION PLUS LG XLONG (DISPOSABLE) ×6 IMPLANT
KIT ABG SYR 3ML LUER SLIP (SYRINGE) IMPLANT
NDL HYPO 25X5/8 SAFETYGLIDE (NEEDLE) IMPLANT
NEEDLE HYPO 25X5/8 SAFETYGLIDE (NEEDLE) IMPLANT
NS IRRIG 1000ML POUR BTL (IV SOLUTION) ×2 IMPLANT
PACK C SECTION WH (CUSTOM PROCEDURE TRAY) ×2 IMPLANT
PAD OB MATERNITY 4.3X12.25 (PERSONAL CARE ITEMS) ×2 IMPLANT
RTRCTR C-SECT PINK 25CM LRG (MISCELLANEOUS) ×1 IMPLANT
SLEEVE SCD COMPRESS KNEE LRG (MISCELLANEOUS) IMPLANT
SLEEVE SCD COMPRESS KNEE MED (MISCELLANEOUS) ×1 IMPLANT
STRIP CLOSURE SKIN 1/2X4 (GAUZE/BANDAGES/DRESSINGS) ×1 IMPLANT
SUT PLAIN 2 0 XLH (SUTURE) ×1 IMPLANT
SUT VIC AB 0 CT1 36 (SUTURE) ×10 IMPLANT
SUT VIC AB 2-0 CT1 27 (SUTURE) ×2
SUT VIC AB 2-0 CT1 TAPERPNT 27 (SUTURE) ×1 IMPLANT
SUT VIC AB 4-0 KS 27 (SUTURE) ×1 IMPLANT
SUT VIC AB 4-0 PS2 27 (SUTURE) ×2 IMPLANT
TOWEL OR 17X24 6PK STRL BLUE (TOWEL DISPOSABLE) ×6 IMPLANT
TRAY FOLEY CATH 14FR (SET/KITS/TRAYS/PACK) IMPLANT
WATER STERILE IRR 1000ML POUR (IV SOLUTION) ×2 IMPLANT
YANKAUER SUCT BULB TIP NO VENT (SUCTIONS) ×1 IMPLANT

## 2012-06-30 NOTE — Op Note (Signed)
Beverly Howe   PROCEDURE DATE: 06/30/2012   PREOPERATIVE DIAGNOSIS: Intrauterine pregnancy at [redacted]w[redacted]d gestation; previous cesarean section; failure to progress: arrest of dilation and descent (failed TOLAC)  POSTOPERATIVE DIAGNOSIS: The same  PROCEDURE: Repeat Low Transverse Cesarean Section  SURGEON:  Scheryl Darter, MD  ASSISTANT:  [name], MD  INDICATIONS: Beverly Howe is a 42 y.o. [phone] with history of one previous cesarean section who presented at [redacted]w[redacted]d in active labor. She desired TOLAC and progressed to 5 cm dilation/-2 station. She failed to progress further after 7 hours despite adequate contractions on pitocin. She was therefore taken for cesarean section secondary to the indications listed under preoperative diagnosis; please see preoperative note for further details.  The risks of cesarean section were discussed with the patient including but were not limited to: bleeding which may require transfusion or reoperation; infection which may require antibiotics; injury to bowel, bladder, ureters or other surrounding organs; injury to the fetus; need for additional procedures including hysterectomy in the event of a life-threatening hemorrhage; placental abnormalities wth subsequent pregnancies, incisional problems, thromboembolic phenomenon and other postoperative/anesthesia complications.   The patient concurred with the proposed plan, giving informed written consent for the procedure.    FINDINGS:  Viable female infant in cephalic presentation.  Apgars 9 and 9.  Clear amniotic fluid.  Intact placenta, three vessel cord.  Normal uterus, fallopian tubes and ovaries bilaterally.  ANESTHESIA: Epidural INTRAVENOUS FLUIDS: 3000 ml ESTIMATED BLOOD LOSS: 800 ml URINE OUTPUT:  100 ml SPECIMENS: Placenta sent to pathology/L&D COMPLICATIONS: None immediate  PROCEDURE IN DETAIL:  The patient preoperatively received intravenous antibiotics and had sequential  compression devices applied to her lower extremities.  She was then taken to the operating room where the epidural anesthesia was dosed up to surgical level and was found to be adequate. She was then placed in a dorsal supine position with a leftward tilt, and prepped and draped in a sterile manner.  A foley catheter was already in place in her bladder and attached to constant gravity.  After an adequate timeout was performed, a Pfannenstiel skin incision was made with scalpel and carried through to the underlying layer of fascia. The fascia was incised in the midline, and this incision was extended bilaterally using the Mayo scissors.  Kocher clamps were applied to the superior aspect of the fascial incision and the underlying rectus muscles were dissected off bluntly. A similar process was carried out on the inferior aspect of the fascial incision. The rectus muscles were separated in the midline bluntly and the peritoneum was entered bluntly. Significant adhesions were encountered at the peritoneum and were carefully dissected with Metzenbaum scissors and electrocautery to expose the lower uterine segment. A bladder blade was placed, and a transverse hysterotomy was made in the lower uterine segment with a scalpel and extended bilaterally bluntly.  The infant was successfully delivered, the cord was clamped and cut and the infant was handed over to awaiting neonatology team. Uterine massage was then administered, and the placenta delivered intact with a three-vessel cord. The uterus was then cleared of clot and debris.  The hysterotomy was closed with 0 Vicryl in a running locked fashion, and an imbricating layer was also placed with 0 Vicryl. The pelvis was cleared of all clot and debris. Hemostasis was confirmed on all surfaces.  The peritoneum was reapproximated using 2-0 Vicryl in a running stitch. The fascia was then closed using 0 Vicryl in a running fashion.  The subcutaneous layer was irrigated,  then  reapproximated with 2-0 plain gut interrupted stitches, and the skin was closed with a 4-0 Vicryl subcuticular stitch.  The patient tolerated the procedure well. Sponge, lap, instrument and needle counts were correct x 2.  The foley drained clear urine throughout the procedure. The patient was taken to the recovery room in stable condition.   [name], MD 06/30/2012 8:54 PM

## 2012-06-30 NOTE — Anesthesia Preprocedure Evaluation (Signed)
Anesthesia Evaluation  Patient identified by MRN, date of birth, ID band Patient awake    Reviewed: Allergy & Precautions, H&P , Patient's Chart, lab work & pertinent test results  Airway Mallampati: III TM Distance: >3 FB Neck ROM: full    Dental No notable dental hx.    Pulmonary neg pulmonary ROS,  breath sounds clear to auscultation  Pulmonary exam normal       Cardiovascular negative cardio ROS  Rhythm:regular Rate:Normal     Neuro/Psych negative neurological ROS  negative psych ROS   GI/Hepatic negative GI ROS, Neg liver ROS,   Endo/Other  negative endocrine ROS  Renal/GU negative Renal ROS     Musculoskeletal   Abdominal   Peds  Hematology negative hematology ROS (+)   Anesthesia Other Findings   Reproductive/Obstetrics (+) Pregnancy                           Anesthesia Physical Anesthesia Plan  ASA: II  Anesthesia Plan: Epidural   Post-op Pain Management:    Induction:   Airway Management Planned:   Additional Equipment:   Intra-op Plan:   Post-operative Plan:   Informed Consent: I have reviewed the patients History and Physical, chart, labs and discussed the procedure including the risks, benefits and alternatives for the proposed anesthesia with the patient or authorized representative who has indicated his/her understanding and acceptance.     Plan Discussed with:   Anesthesia Plan Comments:         Anesthesia Quick Evaluation

## 2012-06-30 NOTE — Progress Notes (Signed)
Patient ID: Beverly Howe, female   DOB: [birth year], 42 y.o.   MRN: [id number]  S:  Comfortable, no pain  O:   Filed Vitals:   06/30/12 1501 06/30/12 1531 06/30/12 1601 06/30/12 1632  BP: 118/49 97/49 116/59 121/62  Pulse: 80 83 79 81  Temp:    98.5 F (36.9 C)  TempSrc:    Oral  Resp: 20 18 20 18   Height:      Weight:      SpO2:        Cervix:  5/80/-2  FHTs:  120s, mod var, occasional accel, early decels MVUs:  Around 100-125  A/P SOL, augmented with pitocin. IUPC and amnioinfusion going, MVUs no longer adequate. No change x 5 hours Will increase pitocin 2 x 2 Discussed with Dr. Debroah Loop. Will continue to allow labor as long as FHTs are reassuring.  [name], MD 06/30/2012 5:01 PM

## 2012-06-30 NOTE — Progress Notes (Signed)
Patient ID: Beverly Howe, female   DOB: 03-22-1971, 42 y.o.   MRN: [id number]  S:  Pt blocked and comfortable. SROM 10:30, some dark blood/brown fluid, otherwise clear.  O:  BP:  104/53  98/50  Cervix: 5/90/-2 AROM forebag IUPC placed without difficulty  FHTs: 115-120, mod var, accels present, decels - either lates or earlies, difficult to tell. TOCO: q 1-5, some doublets  After AROM and IUPC, deep decel to 60-70 for 1 minute, then to 90s for one minute. Good variability throughout. Decel to 90s with next contraction, looks early in nature. Then variables +/- lates for a few more contractions- to 90s with good variability and good recovery.   A/P Continue to labor on own. Ctx appear adequate - will monitor MVUs. Seems to be progressing.  Progressing. Hopeful for VBAC.  [name], MD

## 2012-06-30 NOTE — H&P (Signed)
Beverly Howe is a 42 y.o. female presenting for contractions, early labor. [facility], no complications this pregnancy. Prior c-section 20 years ago in [place] for failure to progress, >8 lb baby. Has signed consent for TOLAC. No bleeding or ROM.  Maternal Medical History:  Reason for admission: Reason for admission: contractions.  Reason for Admission:   nauseaContractions: Onset was yesterday.   Frequency: regular.   Perceived severity is strong.    Fetal activity: Perceived fetal activity is normal.   Last perceived fetal movement was within the past hour.    Prenatal complications: no prenatal complications Prenatal Complications - Diabetes: none.    OB History    Grav Para Term Preterm Abortions TAB SAB Ect Mult Living   2 1 1       1      Past Medical History  Diagnosis Date  . Depression    Past Surgical History  Procedure Date  . Cesarean section    Family History: family history includes Cancer in her mother; Diabetes in her maternal aunt; and Hypertension in her mother. Social History:  reports that she has never smoked. She has never used smokeless tobacco. She reports that she drinks alcohol. She reports that she does not use illicit drugs.   Prenatal Transfer Tool  Maternal Diabetes: No Genetic Screening: Normal Maternal Ultrasounds/Referrals: Normal Fetal Ultrasounds or other Referrals:  None Maternal Substance Abuse:  No Significant Maternal Medications:  None Significant Maternal Lab Results:  Lab values include: Group B Strep positive Other Comments:  Prior c-section for failure to progress, 8 lb baby  Review of Systems  Constitutional: Negative for fever and chills.  Eyes: Negative for blurred vision and double vision.  Gastrointestinal: Negative for nausea and vomiting.  Genitourinary: Negative for dysuria.  Neurological: Negative for headaches.    Dilation: 3 Effacement (%): 90 Station: -3 Exam by::  J. Rasch RN  Blood pressure 114/52, pulse 91, temperature 97.7 F (36.5 C), temperature source Oral, resp. rate 18, height 4\' 10"  (1.473 m), weight 83.008 kg (183 lb), last menstrual period 08/08/2011, SpO2 100.00%. Maternal Exam:  Uterine Assessment: Contraction strength is firm.  Contraction frequency is regular.   Pelvis: questionable for delivery.   Cervix: Cervix evaluated by digital exam.     Fetal Exam Fetal Monitor Review: Mode: ultrasound.   Baseline rate: 130.  Variability: moderate (6-25 bpm).   Pattern: accelerations present and no decelerations.    Fetal State Assessment: Category I - tracings are normal.     Physical Exam  Constitutional: She is oriented to person, place, and time. She appears well-developed and well-nourished. No distress.  HENT:  Head: Atraumatic.  Eyes: Conjunctivae normal and EOM are normal.  Neck: Normal range of motion. Neck supple.  Cardiovascular: Normal rate.   Respiratory: Effort normal. No respiratory distress.  GI: Soft. There is no rebound and no guarding.  Musculoskeletal: Normal range of motion. She exhibits no edema and no tenderness.  Neurological: She is alert and oriented to person, place, and time.  Skin: Skin is warm and dry.  Psychiatric: She has a normal mood and affect.    Prenatal labs: ABO, Rh:   Antibody:   Rubella:   RPR:    HBsAg:    HIV:    GBS: Positive (12/18 0000)   Assessment/Plan: 42 y.o. G2P1001 at [redacted]w[redacted]d with  1.  Spontaneous labor, early:  3 cm, regular contractions 2.  Previous c/section. Desires TOLAC, consent signed. Understands risks. Will  use pitocin only if needed.  3.  AMA:  Quad screen negative 4.  GBS positive:  PCN ordered.  [name], MD 06/30/2012 9:04 AM    [name] 06/30/2012, 9:01 AM

## 2012-06-30 NOTE — Transfer of Care (Signed)
Immediate Anesthesia Transfer of Care Note  Patient: Beverly Howe  Procedure(s) Performed: Procedure(s) (LRB) with comments: CESAREAN SECTION (N/A) - Repeat cesarean section of baby girl at 1934  APGAR 9/9  Patient Location: PACU  Anesthesia Type:Epidural  Level of Consciousness: awake, alert  and oriented  Airway & Oxygen Therapy: Patient Spontanous Breathing  Post-op Assessment: Report given to PACU RN and Post -op Vital signs reviewed and stable  Post vital signs: Reviewed and stable  Complications: No apparent anesthesia complications

## 2012-06-30 NOTE — Anesthesia Postprocedure Evaluation (Signed)
  Anesthesia Post-op Note  Patient: Beverly Howe  Procedure(s) Performed: Procedure(s) (LRB) with comments: CESAREAN SECTION (N/A) - Repeat cesarean section of baby girl at 1934  APGAR 9/9  Patient Location: PACU  Anesthesia Type:Epidural  Level of Consciousness: awake, alert  and oriented  Airway and Oxygen Therapy: Patient Spontanous Breathing  Post-op Pain: none  Post-op Assessment: Post-op Vital signs reviewed, Patient's Cardiovascular Status Stable, Respiratory Function Stable, Patent Airway, No signs of Nausea or vomiting, Pain level controlled, No headache and No backache  Post-op Vital Signs: Reviewed and stable  Complications: No apparent anesthesia complications

## 2012-06-30 NOTE — Progress Notes (Signed)
Patient ID: Beverly Howe, female   DOB: 11/30/1970, 42 y.o.   MRN: [id number]   S:  Patient comfortable.  O:    Filed Vitals:   06/30/12 1731 06/30/12 1801 06/30/12 1831 06/30/12 1846  BP: 112/55 107/45 130/62   Pulse: 77 83 77   Temp:    98.8 F (37.1 C)  TempSrc:    Oral  Resp: 18 20 18    Height:      Weight:      SpO2:        Cervix:  5/90/-2, cervix mildly edematous  FHTs: 120-130, mod var, accels present, repetitive variables with each contraction to 90s. MVUs:  >210  A/P No cervical change for 7 hours despite adequate contracions. Given prior c-section for failure to progress at 5cm, recommended to patient that we proceed to c-section. The Spanish interpreter was called to the room. Discussed risks and benefits of cesarean section.  The risks of cesarean section were discussed with the patient including but were not limited to: bleeding which may require transfusion or reoperation; infection which may require antibiotics; injury to bowel, bladder, ureters or other surrounding organs; injury to the fetus; need for additional procedures including hysterectomy in the event of a life-threatening hemorrhage; placental abnormalities wth subsequent pregnancies, incisional problems, thromboembolic phenomenon and other postoperative/anesthesia complications.  The patient agreed with the proposed plan, giving informed written consent for the procedures.  Anesthesia and OR aware.  Preoperative prophylactic antibiotics and SCDs ordered on call to the OR.  To OR when ready.   [name], MD 06/30/2012 6:56 PM Agree with indication for repeat cesarean section for active phase arrest, counseled patient of the risks above.  ARNOLD,JAMES

## 2012-06-30 NOTE — OR Nursing (Signed)
50 ml blood loss during fundal massage by DLWegner RN 

## 2012-06-30 NOTE — Progress Notes (Signed)
Patient ID: Beverly Howe, female   DOB: 1970-09-17, 42 y.o.   MRN: [id number]  S:  Blocked and comfortable. Feeling a little pressure/minimal pain with contractions  O: Filed Vitals:   06/30/12 1301 06/30/12 1331 06/30/12 1401 06/30/12 1431  BP: 114/63 111/56 120/62 114/56  Pulse: 70 71 68 78  Temp:    98.3 F (36.8 C)  TempSrc:    Oral  Resp: 18 20 18 19   Height:      Weight:      SpO2:        Cervix:  5/80/-2  FHTs:  120-130, mod variability, accels present, decels with majority of contractions, some look like earlies, some with delayed recovery. At 14:08, long variable decel with 30 seconds in the 60s with double/prolonged contraction, good recovery.   MVUs:  <200, ctx pattern is irregular, couplets  A/P SOL, ruptured, ctx pattern not quite adequate, not progressing past 5 cm x 4 hours. Per patient, this is where she stalled with her first delivery 20 years ago. Will start pitocin. Hopeful for VBAC but untried pelvis. Will monitor FHTs closely. Discussed with Dr. Debroah Loop.  [name], MD 06/30/2012 2:46 PM

## 2012-06-30 NOTE — Anesthesia Procedure Notes (Signed)
Epidural Patient location during procedure: OB Start time: 06/30/2012 10:30 AM  Staffing Anesthesiologist: Angus Seller., Harrell Gave. Performed by: anesthesiologist   Preanesthetic Checklist Completed: patient identified, site marked, surgical consent, pre-op evaluation, timeout performed, IV checked, risks and benefits discussed and monitors and equipment checked  Epidural Patient position: sitting Prep: site prepped and draped and DuraPrep Patient monitoring: continuous pulse ox and blood pressure Approach: midline Injection technique: LOR air and LOR saline  Needle:  Needle type: Tuohy  Needle gauge: 17 G Needle length: 9 cm and 9 Needle insertion depth: 5 cm cm Catheter type: closed end flexible Catheter size: 19 Gauge Catheter at skin depth: 10 cm Test dose: negative  Assessment Events: blood not aspirated, injection not painful, no injection resistance, negative IV test and no paresthesia  Additional Notes Patient identified.  Risk benefits discussed including failed block, incomplete pain control, headache, nerve damage, paralysis, blood pressure changes, nausea, vomiting, reactions to medication both toxic or allergic, and postpartum back pain.  Patient expressed understanding and wished to proceed.  All questions were answered.  Sterile technique used throughout procedure and epidural site dressed with sterile barrier dressing. No paresthesia or other complications noted.The patient did not experience any signs of intravascular injection such as tinnitus or metallic taste in mouth nor signs of intrathecal spread such as rapid motor block. Please see nursing notes for vital signs.

## 2012-06-30 NOTE — MAU Note (Signed)
Pt states started leaking clear fluid at 0400 and is contrcting more than yesterday

## 2012-06-30 NOTE — Consult Note (Signed)
Neonatology Note:  Attendance at C-section:  I was asked to attend this repeat C/S at term due to failed TOLAC, FTP. The mother is a G2P1 O pos, GBS pos with an uncomplicated pregnancy. ROM 9 hours prior to delivery, fluid clear. Infant vigorous with good spontaneous cry and tone. Needed only minimal bulb suctioning. Ap 9/9. Lungs clear to ausc in DR. To CN to care of Pediatrician.  Kerem Gilmer, MD  

## 2012-07-01 ENCOUNTER — Encounter (HOSPITAL_COMMUNITY): Payer: Self-pay | Admitting: Obstetrics & Gynecology

## 2012-07-01 LAB — CBC
MCH: 25.3 pg — ABNORMAL LOW (ref 26.0–34.0)
MCHC: 32.3 g/dL (ref 30.0–36.0)
Platelets: 213 10*3/uL (ref 150–400)
RDW: 16.5 % — ABNORMAL HIGH (ref 11.5–15.5)

## 2012-07-01 NOTE — Anesthesia Postprocedure Evaluation (Signed)
  Anesthesia Post-op Note  Patient: Beverly Howe  Procedure(s) Performed: Procedure(s) (LRB) with comments: CESAREAN SECTION (N/A) - Repeat cesarean section of baby girl at 1934  APGAR 9/9  Patient Location: Mother/Baby  Anesthesia Type:Epidural  Level of Consciousness: awake, oriented and patient cooperative  Airway and Oxygen Therapy: Patient Spontanous Breathing and Patient connected to nasal cannula oxygen  Post-op Pain: none  Post-op Assessment: Patient's Cardiovascular Status Stable, Respiratory Function Stable, Patent Airway, No signs of Nausea or vomiting, Adequate PO intake, Pain level controlled, No headache, No backache, No residual numbness and No residual motor weakness  Post-op Vital Signs: Reviewed and stable  Complications: No apparent anesthesia complications

## 2012-07-01 NOTE — Progress Notes (Signed)
Ur chart review completed.  

## 2012-07-01 NOTE — Addendum Note (Signed)
Addendum  created 07/01/12 0807 by Suella Grove, CRNA   Modules edited:Notes Section

## 2012-07-01 NOTE — Progress Notes (Signed)
Bleeding subsided by 0800 this morning per report RN on floor.  States only soaked 3 pads and was then not bleeding heavily .  No methergine needed.

## 2012-07-01 NOTE — Progress Notes (Signed)
Patient was referred for history of depression/anxiety.  * Referral screened out by Clinical Social Worker because none of the following criteria appear to apply:  ~ History of anxiety/depression during this pregnancy, or of post-partum depression.  ~ Diagnosis of anxiety and/or depression within last 3 years  ~ History of depression due to pregnancy loss/loss of child  OR  * Patient's symptoms currently being treated with medication and/or therapy.  Please contact the Clinical Social Worker if needs arise, or by the patient's request.  Pt's symptoms have not required treatment since 2003. She reports feeling fine at this time and will seek medical attention if needed.  

## 2012-07-01 NOTE — Progress Notes (Signed)
Post Partum Day 1 Subjective: Patient complains of slight RLQ and LLQ pain upon palpation.  Patient states that she has soiled 3 pads with moderate bleeding throughout the night.  The nurse states that the patient passed a clot at 0400 and 0730 pad changes.  The patient states that she is not producing gas but has ambulated.  The patient complains of not SOB, or chest pain.  Objective: Blood pressure 97/48, pulse 93, temperature 99.1 F (37.3 C), temperature source Oral, resp. rate 20, height 4\' 10"  (1.473 m), weight 183 lb (83.008 kg), last menstrual period 08/08/2011, SpO2 97.00%, unknown if currently breastfeeding. Clear and equal breath sounds bilaterally.  Heart, normal rate and rhythm. Abdomen tender in the RLQ and LLQ.  Fundus firm.  Slight incisional drainage noted.  Patient's pad was  Thoroughly soiled upon inspection.   Physical Exam:  General: alert Lochia: appropriate Uterine Fundus: firm Incision: slight clear drainage present DVT Evaluation: No evidence of DVT seen on physical exam.   Basename 07/01/12 0535 06/30/12 0810  HGB 8.1* 11.1*  HCT 25.1* 33.6*    Assessment/Plan: DC home tomorrow.    LOS: 1 day   Evalee Mutton 07/01/2012, 7:40 AM  I have seen the patient with the student and agree with the above.

## 2012-07-02 MED ORDER — IBUPROFEN 600 MG PO TABS: 600.0000 mg | ORAL_TABLET | Freq: Four times a day (QID) | ORAL | Status: AC

## 2012-07-02 MED ORDER — OXYCODONE-ACETAMINOPHEN 5-325 MG PO TABS: 1.0000 | ORAL_TABLET | ORAL | Status: DC | PRN

## 2012-07-02 NOTE — Discharge Summary (Signed)
Beverly Howe is a 42 y.o. [phone] with history of one previous cesarean section who presented at [redacted]w[redacted]d in active labor. She desired TOLAC and progressed to 5 cm dilation/-2 station. She failed to progress further after 7 hours despite adequate contractions on pitocin. She was therefore taken for cesarean section.    Obstetric Discharge Summary Reason for Admission: onset of labor Prenatal Procedures: none Intrapartum Procedures: cesarean: low cervical, transverse Postpartum Procedures: none Complications-Operative and Postpartum: none Hemoglobin  Date Value Range Status  07/01/2012 8.1* 12.0 - 15.0 g/dL Final     DELTA CHECK NOTED     REPEATED TO VERIFY     HCT  Date Value Range Status  07/01/2012 25.1* 36.0 - 46.0 % Final    Physical Exam:  General: alert, cooperative, appears stated age and no distress Lochia: appropriate Uterine Fundus: firm Incision: healing well, no significant drainage, no dehiscence, no significant erythema DVT Evaluation: No evidence of DVT seen on physical exam. Negative Homan's sign. No cords or calf tenderness. No significant calf/ankle edema.  Discharge Diagnoses: Term Pregnancy-delivered  Discharge Information: Date: 07/02/2012 Activity: pelvic rest Diet: routine Medications: Ibuprofen and Percocet Condition: stable Instructions: refer to practice specific booklet Discharge to: home Follow-up Information    Follow up with [facility] [place]. Schedule an appointment as soon as possible for a visit in 6 weeks.   Contact information:   [address] [place] [address] [phone]         Newborn Data: Live born female  Birth Weight: 7 lb 0.2 oz (3180 g) APGAR: 9, 9  Home with mother.  Evalee Mutton 07/02/2012, 7:38 AM   I have seen the patient with the student and agree with the above.

## 2012-07-03 NOTE — Discharge Summary (Signed)
Attestation of Attending Supervision of Advanced Practitioner (PA/CNM/NP): Evaluation and management procedures were performed by the Advanced Practitioner under my supervision and collaboration.  I have reviewed the Advanced Practitioner's note and chart, and I agree with the management and plan.  Diedra Sinor, MD, FACOG Attending Obstetrician & Gynecologist Faculty Practice, [facility]  

## 2012-07-04 ENCOUNTER — Encounter: Payer: Self-pay | Admitting: Internal Medicine

## 2012-07-09 LAB — TYPE AND SCREEN
Unit division: 0
Unit division: 0

## 2012-07-31 ENCOUNTER — Ambulatory Visit: Payer: Medicaid Other | Admitting: Family Medicine

## 2014-01-16 IMAGING — US US OB COMP +14 WK
1 series · 12 of 27 positions shown · non-contrast
Comparison: none

[Series 1: us ob detail +14 wk · 12 of 27 slices shown]
[im 2/27]
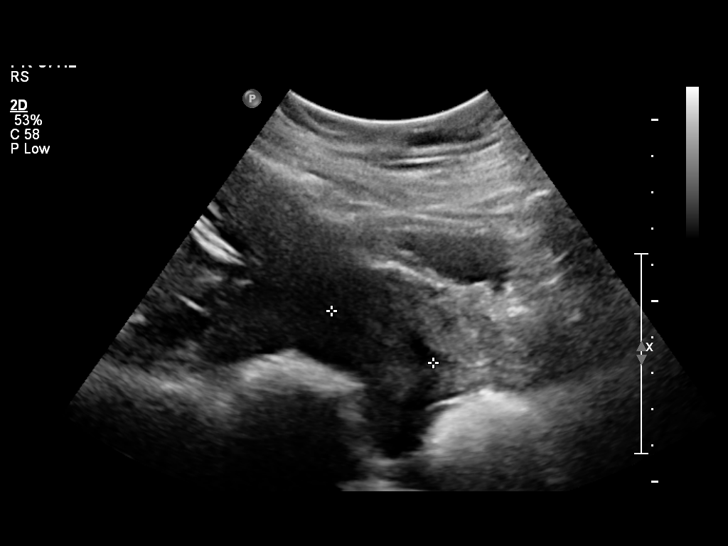
[im 4/27]
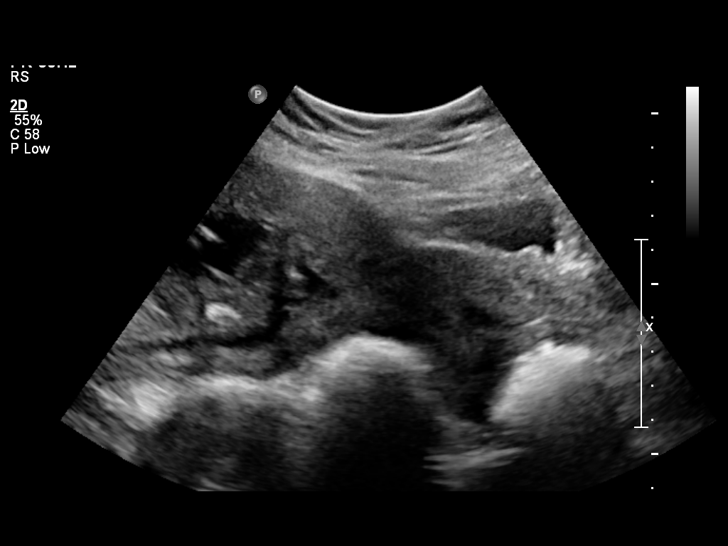
[im 6/27]
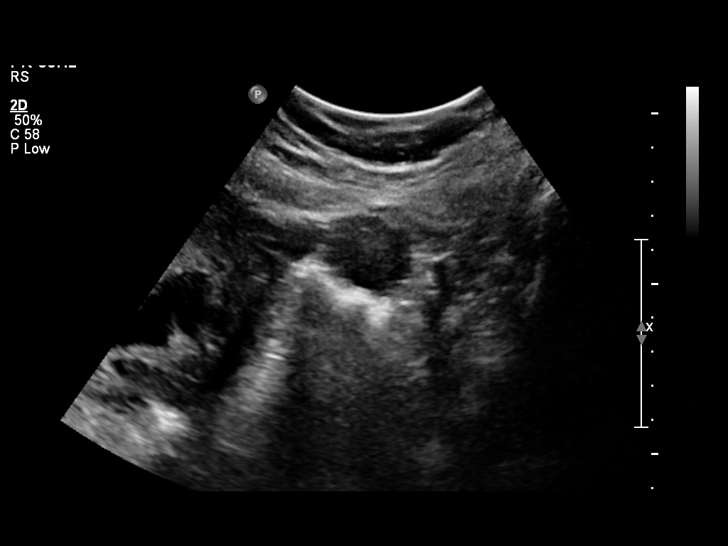
[im 8/27]
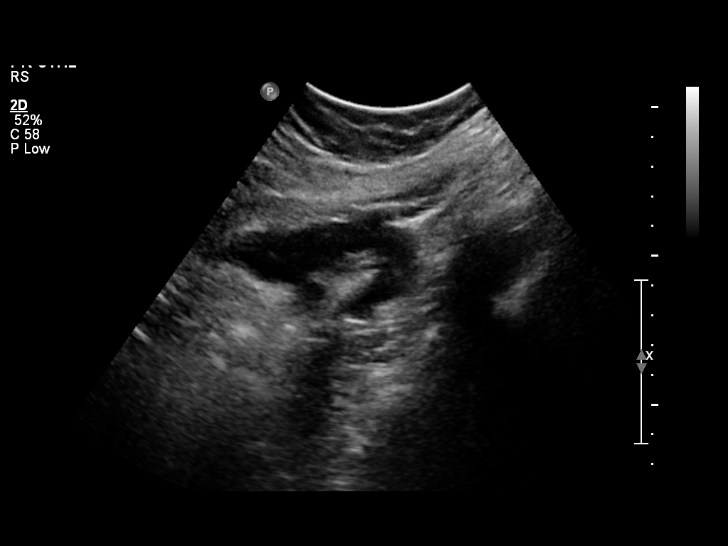
[im 11/27]
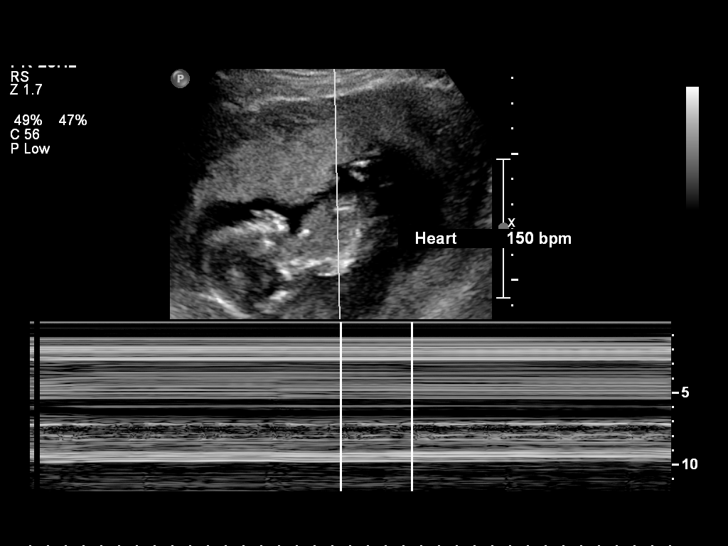
[im 13/27]
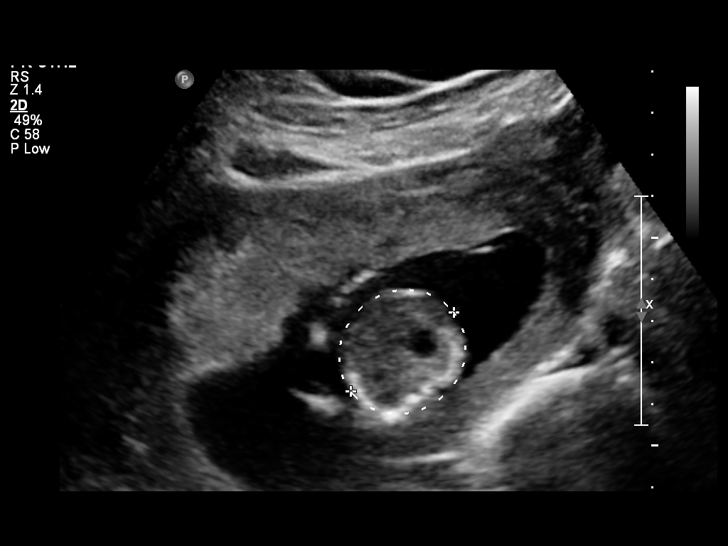
[im 15/27]
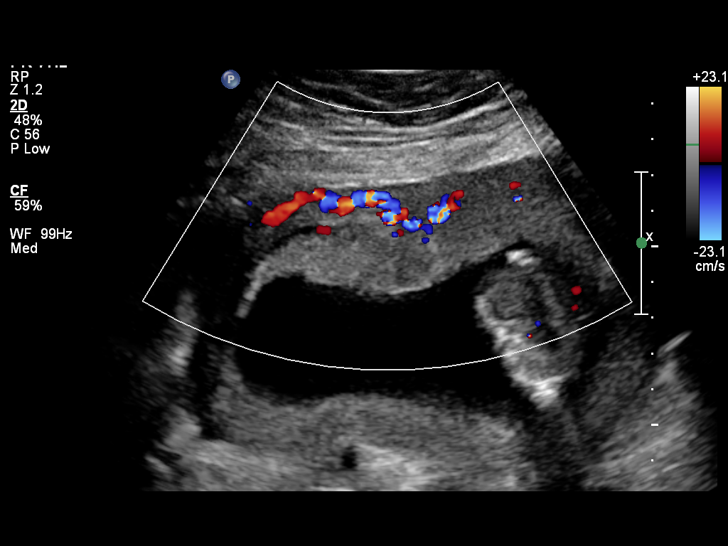
[im 17/27]
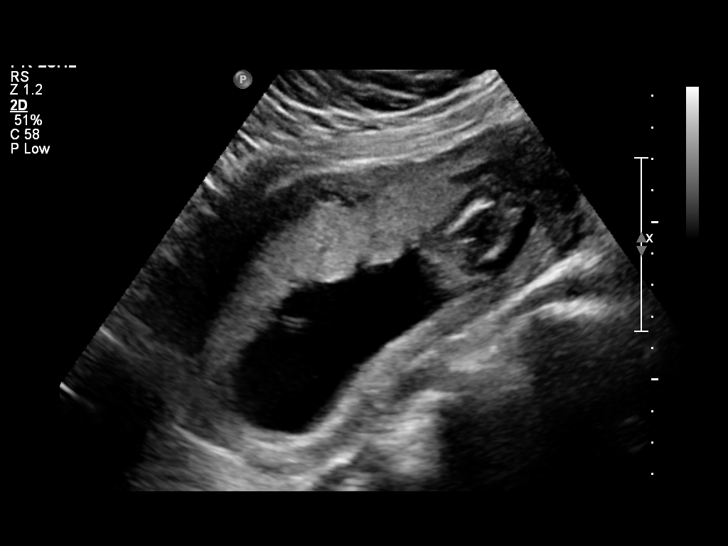
[im 20/27]
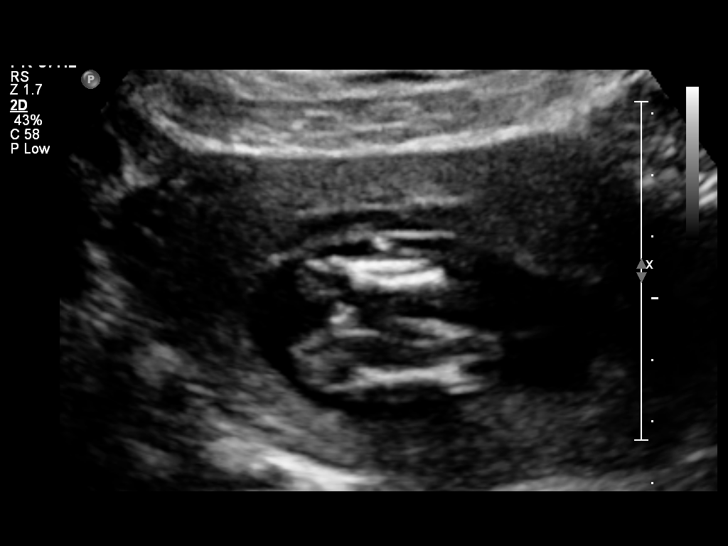
[im 22/27]
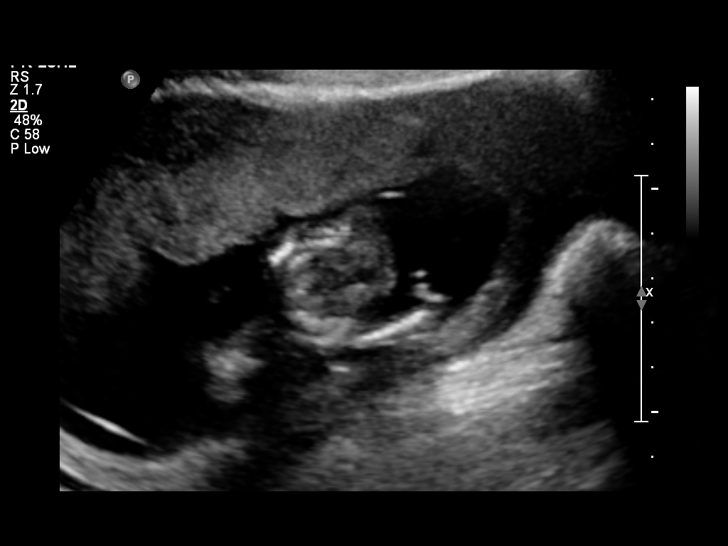
[im 24/27]
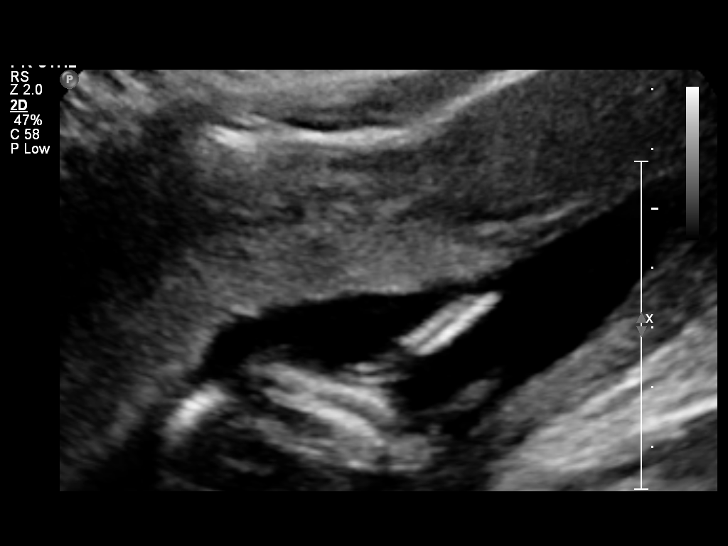
[im 26/27]
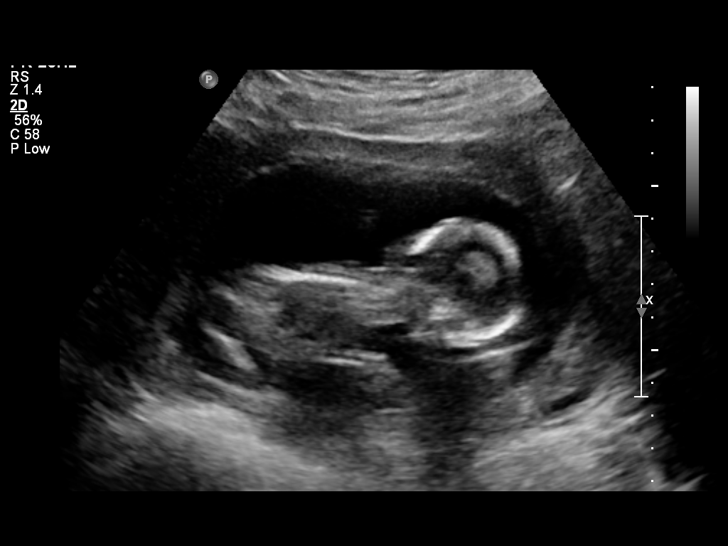

[12 of 27 positions shown; findings below may reference images not displayed]

OBSTETRICS REPORT
                      (Signed Final 01/11/2012 [DATE])

          JURELIX

 Order#:         [id number]
Procedures

 US OB Comp + 14 WK                                    76805.1
Indications

 Detailed fetal anatomic survey
 Unsure of LMP;  Establish Gestational [AGE]
 Advanced maternal age (AMA), Multigravida
 Previous cesarean section
Fetal Evaluation

 Fetal Heart Rate:  150                         bpm
 Cardiac Activity:  Observed
 Presentation:      Breech
 Placenta:          Anterior

 Amniotic Fluid
 AFI FV:      Subjectively within normal limits
Biometry

 BPD:     29.5  mm    G. Age:   15w 2d                CI:        66.46   70 - 86
                                                      FL/HC:      15.6   15.3 -

 HC:       116  mm    G. Age:   15w 5d       48  %    HC/AC:      1.23   1.05 -

 AC:      94.1  mm    G. Age:   15w 4d       57  %    FL/BPD:
 FL:      18.1  mm    G. Age:   15w 2d       43  %    FL/AC:      19.2   20 - 24

 Est. FW:     126  gm      0 lb 4 oz     75  %
Gestational Age

 LMP:           18w 1d       Date:   09/06/11                 EDD:   06/12/12
 U/S Today:     15w 3d                                        EDD:   07/01/12
 Best:          15w 3d    Det. By:   U/S (01/11/12)           EDD:   07/01/12
Anatomy

 Cranium:           Not well            Aortic Arch:       Not well
                    visualized                             visualized
 Fetal Cavum:       Not well            Ductal Arch:       Not well
                    visualized                             visualized
 Ventricles:        Not well            Diaphragm:         Not well
                    visualized                             visualized
 Choroid Plexus:    Not well            Stomach:           Appears
                    visualized                             normal, left
                                                           sided
 Cerebellum:        Not well            Abdomen:           Appears normal
                    visualized
 Posterior Fossa:   Not well            Abdominal Wall:    Appears nml
                    visualized                             (cord insert,
                                                           abd wall)
 Nuchal Fold:       Not well            Cord Vessels:      Not well
                    visualized                             visualized
 Face:              Not well            Kidneys:           Not well
                    visualized                             visualized
 Heart:             Not well            Bladder:           Not well
                    visualized                             visualized
 RVOT:              Not well            Spine:             Not well
                    visualized                             visualized
 LVOT:              Not well            Limbs:             Four extremities
                    visualized                             seen

 Other:     Technically difficult due to early GA.
Cervix Uterus Adnexa

 Cervix:       Closed.
 Left Ovary:   Within normal limits.
 Right Ovary:  Within normal limits.

 Adnexa:     No abnormality visualized.
Impression

 Single intrauterine gestation demonstrating an estimated
 gestational age by ultrasound of 15w 3d. This is 2w 4d
 behind expected estimated gestational age by LMPof 18w 1d
 and suggests best dating by today's exam. Follow up to
 perform a complete anatomic evaluation and assess for
 appropriate interval growth is recommended in 3 weeks..

 Anatomic evaluation is not possible today due to early
 gestational age. Visualized early anatomic landmarks appear
 normal.

 Subjectively and quantitatively normal amniotic fluid volume.

 ARANI HERI with us.  Please do not hesitate to

## 2014-04-20 ENCOUNTER — Encounter: Payer: Self-pay | Admitting: Internal Medicine

## 2016-12-19 MED ORDER — ERGOCALCIFEROL (VITAMIN D2) 1,250 MCG (50,000 UNIT) CAPSULE
ORAL_CAPSULE | ORAL | 0 refills | 0.00000 days | Status: CP
Start: 2016-12-19 — End: 2017-09-13

## 2017-03-01 NOTE — Unmapped (Signed)
Specialty Pharmacy Refill Coordination Note     Leslie Wade is a 46 y.o. female contacted today regarding refills of her specialty medication(s).    Reviewed and verified with patient:      Specialty medication(s) and dose(s) confirmed: yes  Changes to medications: no  Changes to insurance: no    Medication Adherence    Patient reported X missed doses in the last month:  0  Specialty Medication:  METHOTREXATE         Refill Coordination    Has the Patients' Contact Information Changed:  No  Is the Shipping Address Different:  No       Shipping Information    Delivery Scheduled:  Yes  Delivery Date:  03/21/17  Medications to be Shipped:  METHOTREXATE          Follow-up: 12 week(s)     Roselyn Meier  Specialty Pharmacy Technician

## 2017-04-27 ENCOUNTER — Encounter (HOSPITAL_COMMUNITY): Payer: Self-pay

## 2017-05-22 MED ORDER — FOLIC ACID 1 MG TABLET
ORAL_TABLET | Freq: Every day | ORAL | 3 refills | 0.00000 days | Status: CP
Start: 2017-05-22 — End: 2017-08-31

## 2017-05-22 NOTE — Unmapped (Addendum)
[facility] Clinical Assessment & Refill Coordination Note    Specialty Medication(s): subq methotrexate   Indication(s): rheumatoid arthritis   Other medications to be shipped: (folic acid, prednisone)     Leslie Wade, DOB: 04/29/1971  Phone: [phone] (home)   Shipping Address: [address]  [place] [address]  All above HIPAA information was verified with patient.     Completed refill call assessment today to schedule patient's medication shipment from the [facility] [phone]).       Medications reviewed and verified: Allergies - Medications -      Specialty medication(s) and dose(s) confirmed: yes  Changes to medications: no  Changes to insurance: no  Tolerating medications:   Adverse Effects    Arthralgias:  Pos  Joint swelling:  Pos         ADHERENCE     Medication Adherence    Patient Reported X Missed Doses in the Last Month:  1  Specialty Medication:  SUBQ METHOTREXATE   Patient is on additional specialty medications:  No  Informant:  patient  Provider-Estimated Medication Adherence Level:  good  Medication Assistance Program  Refill Coordination  Has the Patients' Contact Information Changed:  No    Is the Shipping Address Different:  No    Shipping Information  Delivery Scheduled:  Yes  Delivery Date:  05/29/17  Medications to be Shipped:  Methotrexate vial, prednisone, folic acid, syringe with needles          ASSESSMENT     Leslie Wade reports tolerating subq methotrexate well without adverse effects. She stopped taking leucovorin as it gives her headaches.  Advised patient to continue taking some form of folic acid and she is agreeable to trying folic acid 1 mg once daily.  Adherence to therapy confirmed with patient and refill record @ [facility].  Patient reports she is still having baseline swelling and joint pains, 2-3 days out of the week.  She is still on prednisone 7.5mg  once daily.  Patient refilled syringe with needles and other medications recently so it's not aligning up with call for methotrexate. She does have pharmacy contact and will reach out if need refills of those medications in a couple of months.    SHIPPING     Delivery Scheduled: yes, Expected medication delivery date: 05/29/17  Next dose of methotrexate from this shipment due on 06/01/17    Does Kaitrin have follow up appointment scheduled with clinic? Yes, appointment is scheduled and patient is aware    All questions were answered and contact information provided for any future questions/concerns.      Jeneen Montgomery

## 2017-08-14 MED ORDER — PREDNISONE 5 MG TABLET
ORAL_TABLET | Freq: Every day | ORAL | 2 refills | 0.00000 days | Status: CP
Start: 2017-08-14 — End: 2017-08-14

## 2017-08-14 MED ORDER — PREDNISONE 5 MG TABLET: 8 mg | tablet | Freq: Every day | 2 refills | 0 days | Status: AC

## 2017-08-14 NOTE — Unmapped (Addendum)
Shipment was delayed-went out 08/28/17 for an 84 day supply of methotrexate/90 days of folic acid  Also received 60 days of folic acid at that time.-mct    [facility] Refill Coordination Note  Specialty Medication(s): METHOTREXATE  Additional Medications shipped: PREDNISONE/ FOLIC ACID/SYRINGES    Leslie Wade, DOB: 1970/09/20  Phone: [phone] (home) , Alternate phone contact: N/A  Phone or address changes today?: No  All above HIPAA information was verified with patient.  Shipping Address: [address]  [place] [address]   Insurance changes? No    Completed refill call assessment today to schedule patient's medication shipment from the [facility] [phone]).      Confirmed the medication and dosage are correct and have not changed: Yes, regimen is correct and unchanged.    Confirmed patient started or stopped the following medications in the past month:  No, there are no changes reported at this time.    Are you tolerating your medication?:  Janissa reports side effects of NAUSEA. Sending a message to Chelsea Aus to let her know the patient's concern.    ADHERENCE    Patient has 2 weeks of methotrexate left but is out of syringes. She will be out of prednisone and folic acid next week as well.    Did you miss any doses in the past 4 weeks? No missed doses reported.    FINANCIAL/SHIPPING    Delivery Scheduled: Yes, Expected medication delivery date: 08/22/17     The patient will receive an FSI print out for each medication shipped and additional FDA Medication Guides as required.  Patient education from [place] or Robet Leu may also be included in the shipment    Dillwyn did not have any additional questions at this time.    Delivery address validated in FSI scheduling system: Yes, address listed in FSI is correct.    We will follow up with patient monthly for standard refill processing and delivery.      Thank you,  Renette Butters   [facility] Shared [facility] Specialty Technician

## 2017-08-14 NOTE — Unmapped (Signed)
Called patient (with assistance of [employer]) to assess side effects of methotrexate reported by [facility] technician. Patient not available at this time and voicemail not set up.    Creig Hines, PharmD Candidate

## 2017-08-31 ENCOUNTER — Ambulatory Visit: Admit: 2017-08-31 | Discharge: 2017-08-31 | Attending: Rheumatology | Primary: Rheumatology

## 2017-08-31 ENCOUNTER — Ambulatory Visit: Admit: 2017-08-31 | Discharge: 2017-08-31

## 2017-08-31 DIAGNOSIS — M05741 Rheumatoid arthritis with rheumatoid factor of right hand without organ or systems involvement: Secondary | ICD-10-CM

## 2017-08-31 DIAGNOSIS — M059 Rheumatoid arthritis with rheumatoid factor, unspecified: Secondary | ICD-10-CM

## 2017-08-31 DIAGNOSIS — M05742 Rheumatoid arthritis with rheumatoid factor of left hand without organ or systems involvement: Secondary | ICD-10-CM

## 2017-08-31 DIAGNOSIS — M0579 Rheumatoid arthritis with rheumatoid factor of multiple sites without organ or systems involvement: Secondary | ICD-10-CM

## 2017-08-31 DIAGNOSIS — Z227 Latent tuberculosis: Principal | ICD-10-CM

## 2017-08-31 DIAGNOSIS — R7989 Other specified abnormal findings of blood chemistry: Secondary | ICD-10-CM

## 2017-08-31 DIAGNOSIS — L940 Localized scleroderma [morphea]: Secondary | ICD-10-CM

## 2017-08-31 DIAGNOSIS — K219 Gastro-esophageal reflux disease without esophagitis: Secondary | ICD-10-CM

## 2017-08-31 DIAGNOSIS — M3500 Sicca syndrome, unspecified: Secondary | ICD-10-CM

## 2017-08-31 LAB — CBC
HEMATOCRIT: 38.5 % (ref 36.0–46.0)
HEMOGLOBIN: 12.8 g/dL (ref 12.0–16.0)
MEAN CORPUSCULAR HEMOGLOBIN: 28.1 pg (ref 26.0–34.0)
MEAN CORPUSCULAR VOLUME: 84.7 fL (ref 80.0–100.0)
MEAN PLATELET VOLUME: 7.6 fL (ref 7.0–10.0)
PLATELET COUNT: 325 10*9/L (ref 150–440)
RED BLOOD CELL COUNT: 4.55 10*12/L (ref 4.00–5.20)
RED CELL DISTRIBUTION WIDTH: 14.5 % (ref 12.0–15.0)
WBC ADJUSTED: 8.3 10*9/L (ref 4.5–11.0)

## 2017-08-31 LAB — PLATELET COUNT: Lab: 325

## 2017-08-31 LAB — ALT (SGPT): Alanine aminotransferase:CCnc:Pt:Ser/Plas:Qn:: 45

## 2017-08-31 LAB — C-REACTIVE PROTEIN: C reactive protein:MCnc:Pt:Ser/Plas:Qn:: 13 — ABNORMAL HIGH

## 2017-08-31 LAB — ALBUMIN: Albumin:MCnc:Pt:Ser/Plas:Qn:: 4.2

## 2017-08-31 LAB — EGFR MDRD NON AF AMER: Glomerular filtration rate/1.73 sq M.predicted.non black:ArVRat:Pt:Ser/Plas/Bld:Qn:Creatinine-based formula (MDRD): 123

## 2017-08-31 LAB — CREATININE: EGFR MDRD NON AF AMER: 123 mL/min/{1.73_m2} (ref >=60)

## 2017-08-31 LAB — AST (SGOT): Aspartate aminotransferase:CCnc:Pt:Ser/Plas:Qn:: 22

## 2017-08-31 MED ORDER — METHOTREXATE SODIUM 25 MG/ML INJECTION SOLUTION
SUBCUTANEOUS | 0 refills | 0.00000 days | Status: CP
Start: 2017-08-31 — End: 2018-02-14

## 2017-08-31 MED ORDER — HYDROXYCHLOROQUINE 200 MG TABLET
ORAL_TABLET | Freq: Two times a day (BID) | ORAL | 3 refills | 0.00000 days | Status: CP
Start: 2017-08-31 — End: 2018-02-14

## 2017-08-31 MED ORDER — FOLIC ACID 1 MG TABLET
ORAL_TABLET | Freq: Every day | ORAL | 3 refills | 0.00000 days | Status: CP
Start: 2017-08-31 — End: 2017-11-16

## 2017-08-31 MED ORDER — METHOTREXATE SODIUM 25 MG/ML INJECTION SOLUTION: 20 mg | mL | 3 refills | 0 days | Status: AC

## 2017-08-31 MED ORDER — HYDROXYCHLOROQUINE 200 MG TABLET: 200 mg | tablet | Freq: Two times a day (BID) | 3 refills | 0 days | Status: AC

## 2017-08-31 NOTE — Unmapped (Signed)
Para la resequedad de boca puede usar: enjuague bucal o spray BIOTENE, tabletas XYLIMELTS y chicle sin azucar.  Se compran sin receta.     Para la resequedad de ojos puede usar: lagrimas artificales que se compran en la farmacia sin receta: Por ejemplo: GENTEAL, REFRESH y otras marcas. Las lagrimas artificiales she usan cuando lo necesite a traves del dia.

## 2017-08-31 NOTE — Unmapped (Signed)
Cc: improved nausea and joint pain .    HPI:    The patient is a 47 y.o. 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 Joint pain started about 1 mo after the birth of her daughter in 2014. She established care at our clinic in 05/2016, began methotrexate in 07/2016.  She also has a longstanding hx of morphea, dx by outside dermatology, path report scanned into media. +ANA 1:160, negative dsDNA, negative ENA.   She had neck lesions concerning for lymphadenopathy at her first visit. US revealed non-enlarged lymph nodes. 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  She has a hx of +TB skin test at age 13. 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   At her last visit, we intended to add HCQ to her regimen. She also was supposed to return in 4 months however, she had to cancel that appointment because she has a daughter with CP who has been having medical problems. She also does not have a PCP and has not been obtaining her methotrexate labs as recommended. She also admits to have been taking calcium and never took the ergocalciferol I recommended last year.     She presents today that she just got over a flu-like illness one to two weeks ago.     In terms of her medications, she never started HCQ because she wanted to see if she would tolerate MTX with the changes we made at the last visit. She also no longer takes leucovorin and is taking folic acid instead. She had resolution of her nausea currently.  Additionally, she only takes prednisone on a prn basis only. She injects MTX sq 20 mg/weekly.     Today she denies joint pain or swelling. Endorses AM stiffness for 5 minutes only. Also endorses burning sensation and itching of the eyes.     Denies fever, chills, fatigue, weight loss, LAD, skin rashes, oral or nasal ulcers, sore throat, alopecia, Raynaud's phenomenon, malar rash, photosensitivity, cough, chest pain, dyspnea, leg edema, hemoptysis, nausea, vomiting, diarrhea, abdominal pain, BRBPR, melena, paresthesias, myalgias, muscle weakness, dysphagia, headaches, dizziness,  ocular pain, ocular erythema, visual loss, diplopia, scalp tenderness, jaw claudication or hearing loss.     No visits with results within 3 Month(s) from this visit.   Latest known visit with results is:   Office Visit on 12/06/2016   Component Date Value Ref Range Status   ??? Vitamin D Total (25OH) 12/06/2016 12.2* 20.0 - 80.0 ng/mL Final   ??? ALT 12/06/2016 21  15 - 48 U/L Final   ??? AST 12/06/2016 16  14 - 38 U/L Final   ??? WBC 12/06/2016 9.5  4.5 - 11.0 10*9/L Final   ??? RBC 12/06/2016 4.21  4.00 - 5.20 10*12/L Final   ??? HGB 12/06/2016 11.9* 12.0 - 16.0 g/dL Final   ??? HCT 16/03/9603 36.7  36.0 - 46.0 % Final   ??? MCV 12/06/2016 87.4  80.0 - 100.0 fL Final   ??? MCH 12/06/2016 28.4  26.0 - 34.0 pg Final   ??? MCHC 12/06/2016 32.5  31.0 - 37.0 g/dL Final   ??? RDW 54/02/8118 14.9  12.0 - 15.0 % Final   ??? MPV 12/06/2016 8.0  7.0 - 10.0 fL Final   ??? Platelet 12/06/2016 318  150 - 440 10*9/L Final   ??? Creatinine 12/06/2016 0.51* 0.60 -  1.00 mg/dL Final   ??? EGFR MDRD Af Amer 12/06/2016 160  >=60 mL/min/1.72m2 Final   ??? EGFR MDRD Non Af Amer 12/06/2016 132  >=60 mL/min/1.8m2 Final     ROS:  10 systems were reviewed and negative except as noted in the HPI.    Past Medical History:   Diagnosis Date   ??? Rheumatoid arthritis (CMS-HCC)    ??? TB lung, latent     Hx of positive PPD at age 42, treated for latent TB x 6 mo in 1999, completed therapy     Immunization History   Administered Date(s) Administered   ??? Influenza Vaccine Quad (IIV4 PF) 60mo+ injectable 06/02/2016   ??? Pneumococcal Conjugate 13-Valent 12/06/2016       MEDICATIONS:  ???  acetaminophen (TYLENOL) 500 MG tablet, Take 500 mg by mouth daily as needed for pain., Disp: , Rfl:   ???  calcium carbonate-vitamin D3 600 mg(1,500mg ) -400 unit per tablet, Take 1 tablet by mouth Two (2) times a day., Disp: 60 tablet, Rfl: 11  ???  etonogestrel (NEXPLANON) 68 mg Impl, 68 mg by Subdermal route once., Disp: , Rfl:   ???  folic acid (FOLVITE) 1 MG tablet, Take 1 tablet (1 mg total) by mouth daily., Disp: 90 tablet, Rfl: 3  ???  hydroxychloroquine (PLAQUENIL) 200 mg tablet, Take 1 tablet (200 mg total) by mouth Two (2) times a day., Disp: 180 tablet, Rfl: 3  ???  methotrexate 25 mg/mL injection solution, Inject 0.8 mL (20 mg total) under the skin once a week., Disp: 12 mL, Rfl: 3  ???  predniSONE (DELTASONE) 5 MG tablet, Take 1.5 tablets (7.5 mg total) by mouth daily., Disp: 135 tablet, Rfl: 2  ???  syringe with needle 1 mL 27 x 1/2 Syrg, 1 mL by Miscellaneous route once a week., Disp: 90 Syringe, Rfl: 3    PHYSICAL EXAM:  BP 108/51 (BP Site: L Arm, BP Position: Sitting, BP Cuff Size: Medium)  - Pulse 80  - Temp 36.6 ??C (97.9 ??F) (Oral)  - Wt 79.7 kg (175 lb 11.3 oz)  - BMI 36.73 kg/m??   GENERAL: WN, WD in NAD.  DERMATOLOGICAL exam:  No sq nodules or vasculitic lesions. Morphea lesions were present a the right forearm and upper back.   Ocular exam: sclera and conjunctiva anicteric and non-injected.   ZOX:WRUE mucosa moist without exudates or ulcers. Neck supple, without JVD, LAD or thyromegaly.  RESPIRATORY: lungs CTA.  CV: S1S2 RRR-M/G/R    GI: abd.: Soft, NT, ND, without OM, normal BS.  P.V.:  -c/c/e, normal pulses.  MUSCULOSKELETAL EXAM:  No STS or tenderness were noted at the MCPs,   PIPs, elbows, shoulders, knees, ankles or MTPs and toes. ROM of the neck  and hips was full in all planes. The right shoulder had restricted internal rotation only.     IMPRESSION:    1-RA, without STS or tenderness on exam today.  2-Nausea due to MTX. Resolved.  3-Morphea. Unchanged  4-No PCP.     PLAN:  I recommend that she establishes care with PCP and will refer her to family medicine. IO also requested that she brings he rmedication bottles with her every visit.    Discontinue the use of prednisone that she was only taking prn.     Will update labs and check today. I counseled her on the importance of obtaining MTX labs every 3 months:   -     25 OH Vit D; Future  -  Albumin; Standing  -     ALT; Standing  -     AST; Standing  -     CBC; Standing  -     Creatinine; Standing  -     CRP  C-Reactive Protein; Future    She did not receive influenza vaccine this season.   -     Pneumococcal Polysaccharide 23-valent SQ/IM (Pneumovax 23) was given today.     -     Ambulatory referral to [facility]; Future    Will update x-rays to today to monitor for disease progression:  -     XR Hand 2 Views Bilateral; Future  -     XR Foot 3 Or More Views Bilateral; Future    Low vitamin D level      -     methotrexate 25 mg/mL injection solution; Inject 0.8 mL (20 mg total) under the skin once a week.  -     Add hydroxychloroquine (PLAQUENIL) 200 mg tablet; Take 1 tablet (200 mg total) by mouth Two (2) times a day. Will need regular ophthalmology visits while on it.   -     folic acid (FOLVITE) 1 MG tablet; Take 1 tablet (1 mg total) by mouth daily.    Sjogren's syndrome, with unspecified organ involvement (CMS-HCC) I counseled her on OTC products.       TB lung, latent: treated 18 years ago.      -    start  calcium carbonate-vitamin D3 600 mg(1,500mg ) -400 unit per tablet; Take 1 tablet by mouth Two (2) times a day.        -    Will recheck vitamin D levels.     RTC in 3 months with Carlus Pavlov and 6 months with me.

## 2017-09-03 LAB — VITAMIN D, TOTAL (25OH): Lab: 9.4 — ABNORMAL LOW

## 2017-09-13 MED ORDER — ERGOCALCIFEROL (VITAMIN D2) 1,250 MCG (50,000 UNIT) CAPSULE
ORAL_CAPSULE | ORAL | 0 refills | 0 days | Status: CP
Start: 2017-09-13 — End: 2017-11-16

## 2017-10-10 NOTE — Unmapped (Signed)
Pt has enough medicine for 1.5 months.  Set up to call end of May

## 2017-11-09 NOTE — Unmapped (Signed)
Patient asked if she could crush hydroxychloroquine and I told her no. It is coated and not recommended to split or crush the tablet. She expressed understanding (verified with Leslie Wade)  She states she is no longer on folic acid because she is now on vitamin D-routing message to confirm with Leslie Wade as we got a new script for the medication  She is off of prednisone at this time as well  Spoke with Leslie Wade with an interpreter    [facility] Refill Coordination Note    Specialty Medication(s) to be Shipped:   Inflammatory Disorders: METHOTREXATE VIALS    Other medication(s) to be shipped: [facility] AND SYRINGES     Leslie Wade, DOB: May 05, 1971  Phone: [phone] (home)   Shipping Address: [address]  [place] [address]    All above HIPAA information was verified with patient.     Completed refill call assessment today to schedule patient's medication shipment from the [facility] [phone]).       Specialty medication(s) and dose(s) confirmed: Regimen is correct and unchanged.   Changes to medications: Leslie Wade reports no changes reported at this time.  Changes to insurance: No  Questions for the pharmacist: No    The patient will receive an FSI print out for each medication shipped and additional FDA Medication Guides as required.  Patient education from [place] or Leslie Wade may also be included in the shipment.    DISEASE-SPECIFIC INFORMATION        For Rheumatology patients: Next dose of METHOTREXATE VIALS from this shipment due on 11/21/17    ADHERENCE     Medication Adherence    Patient reported X missed doses in the last month:  0  Specialty Medication:  methotrexate vials  Patient is on additional specialty medications:  No  Patient is on more than two specialty medications:  No  Any gaps in refill history greater than 2 weeks in the last 3 months:  no  Demonstrates understanding of importance of adherence:  yes  Informant:  patient Confirmed plan for next specialty medication refill:  delivery by pharmacy  Refills needed for supportive medications:  not needed          Refill Coordination    Has the Patients' Contact Information Changed:  No  Is the Shipping Address Different:  No           SHIPPING     Shipping address confirmed in FSI.     Delivery Scheduled: Yes, Expected medication delivery date: 11/14/17 via UPS or courier.     Leslie Wade   [facility] Shared [facility] Specialty Technician

## 2017-11-16 MED ORDER — FOLIC ACID 1 MG TABLET
ORAL_TABLET | Freq: Every day | ORAL | 3 refills | 0.00000 days | Status: CP
Start: 2017-11-16 — End: 2018-02-14

## 2017-11-16 MED ORDER — ERGOCALCIFEROL (VITAMIN D2) 1,250 MCG (50,000 UNIT) CAPSULE: 1 | capsule | 0 refills | 0 days

## 2017-11-16 MED ORDER — FOLIC ACID 1 MG TABLET: 2 mg | tablet | Freq: Every day | 3 refills | 0 days | Status: AC

## 2017-11-16 MED ORDER — ERGOCALCIFEROL (VITAMIN D2) 1,250 MCG (50,000 UNIT) CAPSULE
ORAL_CAPSULE | ORAL | 0 refills | 0.00000 days | Status: CP
Start: 2017-11-16 — End: 2017-11-16

## 2017-11-16 NOTE — Unmapped (Signed)
[facility]: Rheumatology Clinic Assessment and Refill Call    Specialty Medication(s): subq methotrexate  Indication(s): rheumatoid arthritis     Leslie Wade, DOB: 1970/09/08  Above HIPAA information was verified with patient via [employer].      Medications reviewed & verified: Allergies - Medications -      Specialty medication(s) & dose(s) confirmed: yes  Changes to medications: yes - see below   Tolerating medications:   Adverse Effects    Nausea:  Pos  Headaches:  Pos        CLINICAL ASSESSMENT     Leslie Wade was called by the [facility] recently to set up shipment of subq methotrexate.  Methotrexate along with syringes with needles and hydroxychloroquine arrived at patient's house on 11/13/17.  She reports having headache and feeling nauseated for about 2 days following each dose of methotrexate.  She stopped folic acid over a month ago thinking that her vitamin D was to replace folic acid.  I did speak with patient back in February, 2019 and she was on folic acid at that time but was also reporting mild nausea/vomitting.  Dr. Olean Ree did not increase folic acid as her symptoms reportedly resolved per notes in March.  Today, patient reports nausea and headaches were ongoing, maybe less severe when she was on folic acid.  She was on leucovorin previously but thinks leucovorin makes her headaches worst.  Plan to resume folic acid today and increase it to 2 mg once daily - new script sent and med will be delivered on 11/20/17.  She took 4 doses of Vitamin D 50,000 units but stopped also - will send additional 4 capsule to complete supplementation.  Rheumatoid arthritis stable, now off of steroid.  Continue subq methotrexate and advise patient to call clinic with any issues.  .      Does Raschelle have follow up appointment scheduled with clinic? Yes, appointment is scheduled and patient is aware    SHIPPING     ?? Shipping address verified in FSI. Expected medication delivery date: 11/20/17, via UPS.    ?? Medications/items to be shipping:  Folic acid, vitamin D 50,000 units    The patient will receive an FSI print out for each medication shipped and additional FDA Medication Guides as required.  Patient education from [place] or Robet Leu may also be included in the shipment    All questions were answered and contact information provided for any future questions/concerns.      Jeneen Montgomery

## 2017-12-04 NOTE — Unmapped (Signed)
Patient did not show for their rheumatology appointment today.

## 2018-02-04 NOTE — Unmapped (Signed)
[facility] Refill Coordination Note  Medication: methotrexate vials, folic acid, hydroxychloroquine, syringes    Unable to reach patient to schedule shipment for medication being filled at [facility]. no messages were able to be left. number was busy or disconnected.  As this is the 3rd unsuccessful attempt to reach the patient, no additional phone call attempts will be made at this time.      Phone numbers attempted: [phone], [phone]   Last scheduled delivery: 5/27 and 6/3    Please call the [facility] at [phone] (option [phone]) should you have any further questions.      Thanks,  [facility] Shared [facility] Specialty Team

## 2018-02-14 ENCOUNTER — Ambulatory Visit: Admit: 2018-02-14 | Discharge: 2018-02-15 | Attending: Rheumatology | Primary: Rheumatology

## 2018-02-14 DIAGNOSIS — R7989 Other specified abnormal findings of blood chemistry: Secondary | ICD-10-CM

## 2018-02-14 DIAGNOSIS — M05749 Rheumatoid arthritis with rheumatoid factor of unspecified hand without organ or systems involvement: Secondary | ICD-10-CM

## 2018-02-14 DIAGNOSIS — L940 Localized scleroderma [morphea]: Secondary | ICD-10-CM

## 2018-02-14 DIAGNOSIS — M05742 Rheumatoid arthritis with rheumatoid factor of left hand without organ or systems involvement: Secondary | ICD-10-CM

## 2018-02-14 DIAGNOSIS — M3500 Sicca syndrome, unspecified: Principal | ICD-10-CM

## 2018-02-14 DIAGNOSIS — M05741 Rheumatoid arthritis with rheumatoid factor of right hand without organ or systems involvement: Secondary | ICD-10-CM

## 2018-02-14 LAB — CBC
HEMATOCRIT: 39.1 % (ref 36.0–46.0)
HEMOGLOBIN: 12.7 g/dL (ref 12.0–16.0)
MEAN CORPUSCULAR HEMOGLOBIN CONC: 32.4 g/dL (ref 31.0–37.0)
MEAN CORPUSCULAR HEMOGLOBIN: 27.5 pg (ref 26.0–34.0)
MEAN CORPUSCULAR VOLUME: 85 fL (ref 80.0–100.0)
MEAN PLATELET VOLUME: 7.7 fL (ref 7.0–10.0)
PLATELET COUNT: 279 10*9/L (ref 150–440)
RED BLOOD CELL COUNT: 4.6 10*12/L (ref 4.00–5.20)
RED CELL DISTRIBUTION WIDTH: 13.7 % (ref 12.0–15.0)

## 2018-02-14 LAB — MEAN CORPUSCULAR VOLUME: Lab: 85

## 2018-02-14 LAB — ALBUMIN: Albumin:MCnc:Pt:Ser/Plas:Qn:: 4.3

## 2018-02-14 LAB — C-REACTIVE PROTEIN: C reactive protein:MCnc:Pt:Ser/Plas:Qn:: 12 — ABNORMAL HIGH

## 2018-02-14 LAB — ALT (SGPT): Alanine aminotransferase:CCnc:Pt:Ser/Plas:Qn:: 46

## 2018-02-14 LAB — AST (SGOT): Aspartate aminotransferase:CCnc:Pt:Ser/Plas:Qn:: 38

## 2018-02-14 LAB — CREATININE
Creatinine:MCnc:Pt:Ser/Plas:Qn:: 0.58 — ABNORMAL LOW
EGFR CKD-EPI NON-AA FEMALE: >90 mL/min/{1.73_m2} (ref >=60)

## 2018-02-14 MED ORDER — TRIAMCINOLONE ACETONIDE 0.1 % TOPICAL OINTMENT: g | Freq: Two times a day (BID) | 0 refills | 0 days | Status: AC

## 2018-02-14 MED ORDER — METHOTREXATE SODIUM 25 MG/ML INJECTION SOLUTION
0 refills | 0.00000 days | Status: CP
Start: 2018-02-14 — End: 2018-02-14

## 2018-02-14 MED ORDER — METHOTREXATE SODIUM 25 MG/ML INJECTION SOLUTION: mL | 0 refills | 0 days | Status: AC

## 2018-02-14 MED ORDER — TRIAMCINOLONE ACETONIDE 0.1 % TOPICAL OINTMENT
Freq: Two times a day (BID) | TOPICAL | 0 refills | 0.00000 days | Status: CP
Start: 2018-02-14 — End: 2018-04-08
  Filled 2018-02-21: qty 80, 40d supply, fill #0

## 2018-02-14 MED ORDER — CLOBETASOL 0.05 % TOPICAL OINTMENT
Freq: Two times a day (BID) | TOPICAL | 0 refills | 0 days | Status: CP
Start: 2018-02-14 — End: 2018-02-14

## 2018-02-14 MED ORDER — FOLIC ACID 1 MG TABLET
ORAL_TABLET | Freq: Every day | ORAL | 3 refills | 0 days | Status: CP
Start: 2018-02-14 — End: 2018-10-30
  Filled 2018-02-21: qty 180, 90d supply, fill #0

## 2018-02-14 MED ORDER — HYDROXYCHLOROQUINE 200 MG TABLET
ORAL_TABLET | Freq: Two times a day (BID) | ORAL | 3 refills | 0.00000 days | Status: CP
Start: 2018-02-14 — End: 2018-10-30
  Filled 2018-02-21: qty 180, 90d supply, fill #0

## 2018-02-14 NOTE — Unmapped (Signed)
Cc: mild right wrist pain.    HPI:    The patient is a 47 y.o. 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  She also has a longstanding hx of morphea, dx by outside dermatology, path report scanned into media. +ANA 1:160, negative dsDNA, negative ENA.     She had neck lesions concerning for lymphadenopathy at her first visit. US revealed non-enlarged lymph nodes. 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    She has a hx of +TB skin test at age 53. 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     She presents for follow up reporting that she continues to do well. She endorses only mild pain and swelling of the right wrist. Denies other joint symptoms or AM stiffness. She states that she skipped her MTX dose last week due to an URI that she had and is improving now.  In terms of her, morphea, she has not noticed much difference on the current regimen and reports a few more lesions. She has noticed also some new pruritic lesions in her left heel on the plantar aspect and one of her fingers.     She has not established PCP yet.      No visits with results within 3 Month(s) from this visit.   Latest known visit with results is:   Office Visit on 08/31/2017   Component Date Value Ref Range Status   ??? ALT 08/31/2017 45  15 - 48 U/L Final   ??? AST 08/31/2017 22  14 - 38 U/L Final   ??? WBC 08/31/2017 8.3  4.5 - 11.0 10*9/L Final   ??? RBC 08/31/2017 4.55  4.00 - 5.20 10*12/L Final   ??? HGB 08/31/2017 12.8  12.0 - 16.0 g/dL Final   ??? HCT 45/40/9811 38.5  36.0 - 46.0 % Final   ??? MCV 08/31/2017 84.7  80.0 - 100.0 fL Final   ??? MCH 08/31/2017 28.1  26.0 - 34.0 pg Final   ??? MCHC 08/31/2017 33.1  31.0 - 37.0 g/dL Final   ??? RDW 91/47/8295 14.5  12.0 - 15.0 % Final   ??? MPV 08/31/2017 7.6  7.0 - 10.0 fL Final   ??? Platelet 08/31/2017 325  150 - 440 10*9/L Final   ??? Creatinine 08/31/2017 0.54* 0.60 - 1.00 mg/dL Final   ??? EGFR MDRD Af Amer 08/31/2017 149  >=60 mL/min/1.24m2 Final   ??? EGFR MDRD Non Af Amer 08/31/2017 123  >=60 mL/min/1.87m2 Final   ??? Vitamin D Total (25OH) 08/31/2017 9.4* 20.0 - 80.0 ng/mL Final   ??? Albumin 08/31/2017 4.2  3.5 - 5.0 g/dL Final   ??? CRP 62/13/0865 13.0* <10.0 mg/L Final     ROS:  all systems were reviewed and negative except as noted in the HPI.    Past Medical History:   Diagnosis Date   ??? Rheumatoid arthritis (CMS-HCC)    ??? TB lung, latent     Hx of positive PPD at age 89, treated for latent TB x 6 mo in 1999, completed therapy     Immunization History   Administered Date(s) Administered   ??? Influenza Vaccine Quad (IIV4 PF) 53mo+ injectable 06/02/2016   ??? PNEUMOCOCCAL POLYSACCHARIDE 23 08/31/2017   ??? Pneumococcal Conjugate 13-Valent 12/06/2016       MEDICATIONS:    Current Outpatient Medications:   ???  acetaminophen (TYLENOL) 500 MG tablet, Take 500 mg by mouth daily as needed for pain., Disp: , Rfl:   ???  etonogestrel (NEXPLANON) 68 mg Impl, 68 mg by Subdermal route once., Disp: , Rfl:   ???  folic acid (FOLVITE) 1 MG tablet, TAKE 2 TABLETS BY MOUTH ONCE DAILY, Disp: 180 tablet, Rfl: 3  ???  hydroxychloroquine (PLAQUENIL) 200 mg tablet, TAKE 1 TABLET (200 MG) BY MOUTH TWICE DAILY, Disp: 180 tablet, Rfl: 3  ???  methotrexate 25 mg/mL injection solution, INJECT 0.8ML UNDER THE SKIN WEEKLY, Disp: 10 mL, Rfl: 0  ???  ergocalciferol (DRISDOL) 50,000 unit capsule, TAKE 1 CAPSULE BY MOUTH ONCE A WEEK (Patient not taking: Reported on 02/14/2018), Disp: 4 capsule, Rfl: 0    PHYSICAL EXAM:  BP 98/57 (BP Site: R Arm, BP Position: Sitting, BP Cuff Size: Medium)  - Pulse 86  - Temp 36.4 ??C (97.6 ??F) (Oral)  - Ht 147.3 cm (4' 9.99)  - Wt 75.8 kg (167 lb 1.6 oz)  - BMI 34.93 kg/m??   GENERAL: WN, WD in NAD.  DERMATOLOGICAL exam:  No sq nodules or vasculitic lesions. Morphea lesions were present a the right forearm and upper back.   Ocular exam: sclera and conjunctiva anicteric and non-injected.   KGM:WNUU mucosa moist without exudates or ulcers. Neck supple, without JVD, LAD or thyromegaly.  RESPIRATORY: lungs CTA.  CV: S1S2 RRR-M/G/R    GI: abd.: Soft, NT, ND, without OM, normal BS.  P.V.:  -c/c/e, normal pulses.  MUSCULOSKELETAL EXAM:  Swelling and tenderness with restricted extension and flexion was present today. No swelling or tenderness at the left wrist. No swelling or tenderness were noted at the MCPs,   PIPs, elbows, shoulders, knees, ankles or MTPs and toes. ROM of the neck  and hips was full in all planes.     IMPRESSION:    1-RA, stable.  2-Morphea. Unchanged  3-No PCP.     PLAN:    I recommend that she establishes care with PCP and will again refer her to family medicine.     -     Ambulatory referral to [facility] to establish PCP.   -     Ambulatory referral to Dermatology for follow upof morphea.   -     Ambulatory referral to Ophthalmology for HCQ check.     Continue:  -     methotrexate 25 mg/mL injection solution; INJECT 0.8ML UNDER THE SKIN WEEKLY. She declined a further increase in dose.   -     hydroxychloroquine (PLAQUENIL) 200 mg tablet; Take 1 tablet (200 mg total) by mouth Two (2) times a day.  -     folic acid (FOLVITE) 1 MG tablet; Take 2 tablets (2 mg total) by mouth daily.      -     25 OH Vit D; Future  -     Albumin; Standing  -     ALT; Standing  -     AST; Standing  -     CBC; Standing  -     Creatinine; Standing  -     CRP  C-Reactive Protein; Future      Sjogren's syndrome, with unspecified organ involvement (CMS-HCC) . Continue OTC products.       TB lung, latent: treated 18 years ago.                RTC in 5 months.

## 2018-02-19 LAB — VITAMIN D, TOTAL (25OH): Lab: 16.1 — ABNORMAL LOW

## 2018-02-19 MED ORDER — METHOTREXATE SODIUM 25 MG/ML INJECTION SOLUTION
SUBCUTANEOUS | 1 refills | 0 days | Status: CP
Start: 2018-02-19 — End: 2018-10-30

## 2018-02-19 MED ORDER — SYRINGE WITH NEEDLE 1 ML 27 X 1/2"
SUBCUTANEOUS | 0 refills | 0 days | Status: CP
Start: 2018-02-19 — End: 2018-10-30

## 2018-02-19 MED ORDER — EMPTY CONTAINER
Freq: Once | 3 refills | 0 days | Status: CP | PRN
Start: 2018-02-19 — End: ?

## 2018-02-19 NOTE — Unmapped (Signed)
[facility]: Rheumatology Clinic Assessment and Refill Call    Specialty Medication(s): subq methotrexate   Indication(s): Rheumatoid arthritis     Leslie Wade, DOB: 03/11/1971  Above HIPAA information was verified with patient.      Medications reviewed & verified: Allergies - Medications -      Specialty medication(s) & dose(s) confirmed: yes  Changes to medications: no  Tolerating medications:   Adverse Effects    *All other systems reviewed and are negative        Amount of medications patient has on hand:  1 dose for tomorrow     CLINICAL ASSESSMENT     Leslie Wade reports tolerating subq methotrexate well without adverse effects.  Adherence to therapy confirmed with patient and refill record @ [facility].  Patient reports 1 missed dose(s) of subq methotrexate the past few months due to URI.  Patient was seen in clinic on 02/14/18 - please refer to Dr. Joetta Manners notes for detailed clinical assessment.  As therapy is effective, appropriate to continue at this time.  New phone # updated in system.    Does Berta have follow up appointment scheduled with clinic? Yes, appointment is scheduled and patient is aware    SHIPPING     ?? Shipping address verified in FSI.  Expected medication delivery date: 02/22/18, via UPS.  Next dose of subq methotrexate from this shipment due on 02/27/18.  ?? Other medications/items to be shipping:  Methotrexate vials, folic acid, hydroxychloroquine, syringes with needles, sharps container, triamcinolone     The patient will receive an FSI print out for each medication shipped and additional FDA Medication Guides as required.  Patient education from [place] or Robet Leu may also be included in the shipment    All questions were answered and contact information provided for any future questions/concerns.      Jeneen Montgomery

## 2018-02-21 MED FILL — HYDROXYCHLOROQUINE 200 MG TABLET: 90 days supply | Qty: 180 | Fill #0 | Status: AC

## 2018-02-21 MED FILL — METHOTREXATE SODIUM 25 MG/ML INJECTION SOLUTION: 84 days supply | Qty: 10 | Fill #0 | Status: AC

## 2018-02-21 MED FILL — METHOTREXATE SODIUM 25 MG/ML INJECTION SOLUTION: SUBCUTANEOUS | 84 days supply | Qty: 10 | Fill #0

## 2018-02-21 MED FILL — TRIAMCINOLONE ACETONIDE 0.1 % TOPICAL OINTMENT: 40 days supply | Qty: 80 | Fill #0 | Status: AC

## 2018-02-21 MED FILL — FOLIC ACID 1 MG TABLET: 90 days supply | Qty: 180 | Fill #0 | Status: AC

## 2018-02-21 MED FILL — EMPTY CONTAINER: 120 days supply | Qty: 1 | Fill #0 | Status: AC

## 2018-02-21 MED FILL — BD TUBERCULIN SYRINGE 1 ML 27 X 1/2": 84 days supply | Qty: 12 | Fill #0 | Status: AC

## 2018-02-21 MED FILL — EMPTY CONTAINER: 120 days supply | Qty: 1 | Fill #0

## 2018-02-21 MED FILL — BD TUBERCULIN SYRINGE 1 ML 27 X 1/2": SUBCUTANEOUS | 84 days supply | Qty: 12 | Fill #0

## 2018-04-08 ENCOUNTER — Ambulatory Visit: Admit: 2018-04-08 | Discharge: 2018-04-09 | Attending: Dermatology | Primary: Dermatology

## 2018-04-08 DIAGNOSIS — L249 Irritant contact dermatitis, unspecified cause: Secondary | ICD-10-CM

## 2018-04-08 DIAGNOSIS — L304 Erythema intertrigo: Secondary | ICD-10-CM

## 2018-04-08 DIAGNOSIS — M069 Rheumatoid arthritis, unspecified: Secondary | ICD-10-CM

## 2018-04-08 DIAGNOSIS — L851 Acquired keratosis [keratoderma] palmaris et plantaris: Secondary | ICD-10-CM

## 2018-04-08 DIAGNOSIS — L940 Localized scleroderma [morphea]: Principal | ICD-10-CM

## 2018-04-08 MED ORDER — NYSTATIN 100,000 UNIT/GRAM TOPICAL CREAM
Freq: Two times a day (BID) | TOPICAL | 5 refills | 0.00000 days | Status: CP
Start: 2018-04-08 — End: ?
  Filled 2018-04-09: qty 30, 30d supply, fill #0

## 2018-04-08 MED ORDER — TRIAMCINOLONE ACETONIDE 0.5 % TOPICAL OINTMENT
Freq: Two times a day (BID) | TOPICAL | 2 refills | 0.00000 days | Status: CP
Start: 2018-04-08 — End: 2019-04-08

## 2018-04-08 MED ORDER — TRIAMCINOLONE ACETONIDE 0.1 % TOPICAL OINTMENT
Freq: Two times a day (BID) | TOPICAL | 3 refills | 0.00000 days | Status: CP
Start: 2018-04-08 — End: 2018-06-27
  Filled 2018-04-09: qty 60, 30d supply, fill #0

## 2018-04-08 MED ORDER — HYDROCORTISONE 2.5 % TOPICAL OINTMENT
Freq: Two times a day (BID) | TOPICAL | 5 refills | 0.00000 days | Status: CP
Start: 2018-04-08 — End: ?
  Filled 2018-04-10: qty 85.05, 30d supply, fill #0

## 2018-04-08 MED ORDER — BETAMETHASONE DIPROPIONATE 0.05 % TOPICAL OINTMENT
Freq: Two times a day (BID) | TOPICAL | 3 refills | 0.00000 days | Status: CP
Start: 2018-04-08 — End: 2019-04-08

## 2018-04-08 NOTE — Unmapped (Signed)
Dermatology Clinic Note    ASSESSMENT AND PLAN:  1. 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 Several plaques appear burnt out but discussed treating all with topical cortisone as she still notes occasional burning, pruritus in several. Upper back with peripheral rim of erythema/violaceous color surrounding plaques and discussed importance of also treating these.   - Discussed condition and management strategies  - Reviewed that Methotrexate is a first line therapy for management of Morphea and that this is likely helping with condition  - Start betamethasone dipropionate ointment BID to affected areas  - Agree with initiation of Hydroxychloroquine 200 mg BID for morphea Lucianne Muss AB, Blixt EK et al. Treatment of morphea with hydroxychloroquine: A retrospective review of 66 patients at [facility], [phone]. JAAD: June  2019: (80): [phone])     2. Intertrigo, inframammary skin, pannus and inguinal folds  - Discussed that friction, moisture can cause irritation in the skin folds in addition to morphea  - Start hydrocortisone 2.5% cream BID prn pruritus, pain, redness  - Can mix with nystatin cream and apply BID  - Once resolved, recommend OTC Gold Bond Powder qd to prevent moisture, friction       3. Focal Keratoderma, bilateral plantar surfaces, ddx: keratoderma climactericum vs medication induced keratoderma (med review did not identify eliciting medications)   - Discussed use of OTC 40% Urea cream to damp skin BID with application of betamethasone dipropionate ointment BID       4. Irritant Dermatitis, hands  - Discussed importance of using cotton gloves when hands exposed to water, use of gentle, unscented soaps, and use of bland emollients BID to affected areas  - Apply vaseline ointment BID to fissures on fingers  - Can apply betamethasone dipropionate ointment BID prn pain, pruritus         RTC: 8-12 weeks       CC: evaluation of morphea    HPI:  This is a pleasant 47 y.o. female with pmhx of Rheumatoid Arthritis, Latent TB, and Morphea, who is seen today in consultation at the request of [place] by Dr. Izora Ribas for evaluation of plaque type morphea. Notes history of plaque type morphea since 2006, when it was biopsied at an outside dermatologist. Majority of lesions are on trunk and upper extremities. Associated with pruritus, burning sensation. Previously had plaques on R inframammary skin and inguinal folds bilaterally. She notes that she still experiences associated pruritus in these areas. She denies lesion that restrict movement of joints. She also has Rheumatoid Arthritis (RF ~600; CCP 228) with non erosive disease.  +ANA 1:160, negative dsDNA, negative ENA.     Also reports history of painful skin areas on plantar surfaces, present for 1 month. Walks often to appointments and when talking care of children. Associated with tenderness with ambulation. Not using anything to treat areas. Denies involvement of hands.     Also notes painful fissure on lateral R 2nd finger. Washes dishes at home often. Using hot water to wash hands with scented soaps. Occasionally will apply moisturizing cream to areas. Denies use of treatment by a doctor.       Pertinent PMH:   Rheumatoid Arthritis   H/o latent TB, treated at age 81 yo (scanned into media)    Family History: Negative for melanoma and nonmelanoma skin cancer    Social History: Endorses sunscreen use.    ROS: Baseline state of health. No recent illnesses, denies fevers, chills, loss of apetite or weight changes. No other  skin complaints except as noted per HPI. The balance of 10 systems reviewed was negative.    PE:  General: Well-developed, well-nourished female in no acute distress, resting comfortably.  Neuro: Alert and oriented, answers questions appropriately.  Skin: Examination of the face, neck, chest, back, abdomen, bilateral upper and lower extremities, palms, soles, and nails was performed and notable for the following:  - hyperkeratotic plaques with fissuring located on plantar surfaces of bilateral great toes, and hindfeet           - hyperpigmentation located on bilateral inguinal folds   - brown plaques located on upper back, abdomen, and volar forearms, arms, R dorsal hand.                       - upper back with atrophic brown to violaceous plaques

## 2018-04-08 NOTE — Unmapped (Addendum)
Applica la crema de hydrocortisone 2.5% cream a la piel debajo del pecho y los areas de abdomen y de los muslos internas. Puede mezclar la crema de nystatin con hydrocortisone y pone en las areas dos veces al dia para rash hasta se desparecido    Applica la crema de betamethasone a las manchas de la piel en su cuerpo. Es una crema mas fuerte y no [place] al pecho, abdomen y las muslos internos.    Para la pie, puede applicar una cream de Urea (puede comprar sin receta a la farmacia) cuando el piel esta todavia mojado y puede usar la crema de betamethasone por dolor, quema, picaccion. Hay una producto que se llama Baby Foot Peel a la pharmacia, que es [place] similar.         Patient Education        Esclerodermia: Instrucciones de cuidado - [ Scleroderma: Care Instructions ]  Instrucciones de cuidado    La esclerodermia es una enfermedad que afecta la piel y las articulaciones. Algunas veces tambi??n afecta ??rganos tales como los ri??ones, el es??fago y los pulmones. La esclerodermia que afecta solamente la piel se llama localizada. Si afecta ??rganos y piel, se llama esclerodermia sist??mica. La esclerodermia es m??s com??n en mujeres de 20 a 40 a??os de edad.  La esclerodermia provoca endurecimiento y tensi??n en la piel. Las articulaciones podr??an ponerse r??gidas e hinchadas. Si la enfermedad afecta los ??rganos, puede causar problemas m??s serios. La esclerodermia que afecta los pulmones le podr??a dificultar la respiraci??n. Usted podr??a tener una insuficiencia card??aca si la esclerodermia afecta los vasos sangu??neos que conducen del coraz??n a los pulmones.  La esclerodermia no tiene [place], Biomedical engineer en algunos casos la afecci??n podr??a mejorar con el tiempo. El tipo de tratamiento depende de si la enfermedad afecta solamente la piel u otras partes del cuerpo. Usted tendr?? un equipo de profesionales de la salud que le ayudar??. Esto puede incluir un m??dico, un fisioterapeuta, un psic??logo, un dentista y un farmac??utico. Probablemente, necesitar?? medicamentos para tratar los s??ntomas y prevenir problemas a Air cabin crew.  Descubrir que tiene esclerodermia puede Designer, industrial/product. Es posible que sienta muchas emociones y que necesite ayuda para sobrellevarlas. Busque apoyo en sus familiares, amigos y consejeros. Tambi??n hay cosas que puede hacer en su hogar para sentirse mejor mientras est?? en tratamiento.  La atenci??n de seguimiento es una parte clave de su tratamiento y seguridad. Aseg??rese de hacer y acudir a todas las citas, y llame a su m??dico si est?? teniendo problemas. Tambi??n es una buena idea [employer] de sus ex??menes y [employer] de los medicamentos que toma.  ??C??mo puede cuidarse en el hogar?  ?? [employer] medicamentos exactamente como le fueron recetados. Llame a su m??dico si tiene alg??n problema con los medicamentos.  ?? Tome los analg??sicos (medicamentos para el dolor) exactamente seg??n las indicaciones.  ? Si el m??dico le recet?? analg??sicos, t??melos seg??n las indicaciones.  ? Si no est?? tomando un analg??sico recetado, preg??ntele a su m??dico si puede tomar uno de [employer].  ?? Si tiene problemas para [employer], preg??ntele a su m??dico o al [employer] formas de hacer distintas tareas o los dispositivos que pueden ayudarle. Podr??a utilizar velcro en la ropa en lugar de botones, y conseguir art??culos como cepillos para el cabello con mangos especiales.  ?? Revise su presi??n arterial todos los d??as. Llame a su m??dico si es m??s alta de lo normal.  ?? Apl??quese la vacuna contra  la gripe y la vacuna antineumoc??cica para prevenir infecciones pulmonares.  ?? Utilice cremas y lociones despu??s de ba??arse para mantener la piel suave y flexible. En el verano, utilice protector solar cuando salga para prevenir da??os en la piel.  ?? Haga ejercicio con regularidad. Esto le puede ayudar a mantenerse fuerte y flexible.  ?? L??vese los dientes y use hilo dental todos los d??as. Obtenga atenci??n dental regular para prevenir problemas dentales graves.  ?? Hable con su pareja si tiene problemas sexuales.  ?? Exprese sus sentimientos. El estr??s y la tensi??n afectan nuestras emociones. Al expresar sus sentimientos a Economist, podr??a entenderlos y sobrellevarlos.  ?? ??nase a un grupo de apoyo. Hablar sobre un problema con su pareja, un buen amigo u otras personas con problemas similares es una manera valiosa de reducir la tensi??n y el estr??s.  ?? Pida ayuda si la necesita. Hable de sus inquietudes con su m??dico, consejero u otro profesional de [employer].  ??Cu??ndo debe pedir ayuda?  Llame al 911 en cualquier momento que considere que necesita atenci??n de emergencia. Por ejemplo, llame si:  ?? ?? Tiene s??ntomas de un ataque al Filbert Berthold??n. Estos pueden incluir:  ? Dolor o presi??n en el pecho, o una sensaci??n extra??a en el pecho.  ? Sudoraci??n.  ? Falta de aire.  ? N??useas o v??mito.  ? Dolor, presi??n o una sensaci??n extra??a en la espalda, el cuello, la mand??bula, la parte superior del abdomen o en uno o ambos hombros o brazos.  ? Aturdimiento o debilidad repentina.  ? Latidos del coraz??n r??pidos o irregulares.  Despu??s de llamar al  911, es posible que el operador le diga que mastique 1 aspirina para adultos o de 2 a 4 aspirinas de dosis baja. Espere a una ambulancia. No intente conducir usted mismo.   ??Llame a su m??dico ahora mismo o busque atenci??n m??dica inmediata si:  ?? ?? Tiene problemas relacionados con la esclerodermia que le preocupan.   ?? ?? Se siente deprimido o no disfruta de las [employer] produc??an Immunologist.   ?? ?? Tiene las articulaciones hinchadas y enrojecidas.   ?? ?? Tiene problemas para respirar.   ??Preste especial atenci??n a los [employer] y aseg??rese de comunicarse con su m??dico si:  ?? ?? No mejora como se esperaba.   ??D??nde puede encontrar m??s informaci??n en ingl??s?  Laurena Bering a [facility] en [email or web address]  [employer] (Biblioteca de Salud) en el men?? Resources (Recursos). Marcelino Freestone Z610 en la b??squeda para aprender m??s acerca de Esclerodermia: Instrucciones de cuidado - [ Scleroderma: Care Instructions ].  Revisado: 1 abril, 2019  Versi??n del contenido: 12.2  ?? 2006-2019 Healthwise, Incorporated. Las instrucciones de cuidado fueron adaptadas bajo licencia por [facility]. Si usted tiene preguntas sobre una afecci??n m??dica o sobre estas instrucciones, siempre pregunte a su profesional de salud. Healthwise, Incorporated niega toda garant??a o responsabilidad por su uso de esta informaci??n.

## 2018-04-09 MED FILL — TRIAMCINOLONE ACETONIDE 0.1 % TOPICAL OINTMENT: 30 days supply | Qty: 60 | Fill #0 | Status: AC

## 2018-04-09 MED FILL — NYSTATIN 100,000 UNIT/GRAM TOPICAL CREAM: 30 days supply | Qty: 30 | Fill #0 | Status: AC

## 2018-04-10 MED FILL — HYDROCORTISONE 2.5 % TOPICAL OINTMENT: 30 days supply | Qty: 85 | Fill #0 | Status: AC

## 2018-05-20 NOTE — Unmapped (Signed)
[facility] Refill Coordination Note    Specialty Medication(s) to be Shipped:   Inflammatory Disorders: methotrexate vials    Other medication(s) to be shipped: triamcinolone, nystatin, hydroxychloroquine, hydrocortisone, folic acid, sharps container, syringes,     Leslie Wade, DOB: 1970-11-22  Phone: [phone] (home)       All above HIPAA information was verified with patient. via interpretor    Completed refill call assessment today to schedule patient's medication shipment from the [facility] [phone]).       Specialty medication(s) and dose(s) confirmed: Regimen is correct and unchanged.   Changes to medications: Jeanette reports no changes reported at this time.  Changes to insurance: No  Questions for the pharmacist: No    The patient will receive a drug information handout for each medication shipped and additional FDA Medication Guides as required.      DISEASE/MEDICATION-SPECIFIC INFORMATION        For patients on injectable medications: Patient currently has 2 doses left.  Next injection is scheduled for wednesday.    SPECIALTY MEDICATION ADHERENCE     Medication Adherence    Patient reported X missed doses in the last month:  0  Specialty Medication:  methotrexate vials  Patient is on additional specialty medications:  No  Patient is on more than two specialty medications:  No  Any gaps in refill history greater than 2 weeks in the last 3 months:  no  Demonstrates understanding of importance of adherence:  yes  Informant:  patient              Confirmed plan for next specialty medication refill:  delivery by pharmacy  Refills needed for supportive medications:  not needed          Refill Coordination    Has the Patients' Contact Information Changed:  No  Is the Shipping Address Different:  No       Patient has 2 doses of methotrexate on hand (14 days)  Hydroxychloroquine (10 days on hand)  Folic acid (some on hand -a couple weeks)    SHIPPING Shipping address confirmed in Epic.     Delivery Scheduled: Yes, Expected medication delivery date: 12/6.     Medication will be delivered via UPS to the home address in Epic WAM.    Renette Butters   [facility] Specialty Technician

## 2018-05-23 MED FILL — TRIAMCINOLONE ACETONIDE 0.1 % TOPICAL OINTMENT: TOPICAL | 30 days supply | Qty: 60 | Fill #1

## 2018-05-23 MED FILL — HYDROXYCHLOROQUINE 200 MG TABLET: 90 days supply | Qty: 180 | Fill #1 | Status: AC

## 2018-05-23 MED FILL — EMPTY CONTAINER: 120 days supply | Qty: 1 | Fill #1

## 2018-05-23 MED FILL — NYSTATIN 100,000 UNIT/GRAM TOPICAL CREAM: 30 days supply | Qty: 30 | Fill #1 | Status: AC

## 2018-05-23 MED FILL — TRIAMCINOLONE ACETONIDE 0.1 % TOPICAL OINTMENT: 30 days supply | Qty: 60 | Fill #1 | Status: AC

## 2018-05-23 MED FILL — METHOTREXATE SODIUM 25 MG/ML INJECTION SOLUTION: SUBCUTANEOUS | 70 days supply | Qty: 10 | Fill #1

## 2018-05-23 MED FILL — EMPTY CONTAINER: 120 days supply | Qty: 1 | Fill #1 | Status: AC

## 2018-05-23 MED FILL — HYDROXYCHLOROQUINE 200 MG TABLET: ORAL | 90 days supply | Qty: 180 | Fill #1

## 2018-05-23 MED FILL — HYDROCORTISONE 2.5 % TOPICAL OINTMENT: 30 days supply | Qty: 85 | Fill #1 | Status: AC

## 2018-05-23 MED FILL — BD TUBERCULIN SYRINGE 1 ML 27 X 1/2": 84 days supply | Qty: 12 | Fill #1 | Status: AC

## 2018-05-23 MED FILL — FOLIC ACID 1 MG TABLET: ORAL | 90 days supply | Qty: 180 | Fill #1

## 2018-05-23 MED FILL — HYDROCORTISONE 2.5 % TOPICAL OINTMENT: TOPICAL | 30 days supply | Qty: 85.05 | Fill #1

## 2018-05-23 MED FILL — FOLIC ACID 1 MG TABLET: 90 days supply | Qty: 180 | Fill #1 | Status: AC

## 2018-05-23 MED FILL — BD TUBERCULIN SYRINGE 1 ML 27 X 1/2": SUBCUTANEOUS | 84 days supply | Qty: 12 | Fill #1

## 2018-05-23 MED FILL — METHOTREXATE SODIUM 25 MG/ML INJECTION SOLUTION: 70 days supply | Qty: 10 | Fill #1 | Status: AC

## 2018-05-23 MED FILL — NYSTATIN 100,000 UNIT/GRAM TOPICAL CREAM: TOPICAL | 30 days supply | Qty: 30 | Fill #1

## 2018-06-27 ENCOUNTER — Ambulatory Visit
Admit: 2018-06-27 | Discharge: 2018-06-28 | Attending: Student in an Organized Health Care Education/Training Program | Primary: Student in an Organized Health Care Education/Training Program

## 2018-06-27 DIAGNOSIS — L940 Localized scleroderma [morphea]: Principal | ICD-10-CM

## 2018-06-27 DIAGNOSIS — M05749 Rheumatoid arthritis with rheumatoid factor of unspecified hand without organ or systems involvement: Secondary | ICD-10-CM

## 2018-06-28 NOTE — Unmapped (Signed)
1. Start artificial tears to 4 times a day. Do not use anything with the label redness relief or get the red out. Brands I like include Tears Naturale, Soothe, Refresh, Theratears, and Systane.     If using more than 3-4 times per day, get preservative-free kind. These come in individual vials. They may be cheaper on Amazon.com.    2.  Use lubricating ointment into the eye in between the eyelids prior to bedtime. There are many different brands but I prefer Genteal or Refresh PM.

## 2018-06-28 NOTE — Unmapped (Signed)
#   New Plaquenil use:  - Indication: RA  - Plaquenil dose and start date: 200mg  BID, starting 2018  - Current dose: 200mg  BID Risk for toxicity is least with less than 5 mg/kg/day and Cumulative dose < 1000 g   - Risk factors: Daily dosage 5.3 ( >5mg /kg/day)  - Baseline exam: Normal  - No corneal verticillata or pigmentary changes in the macula or bulls eye maculopathy.  - DFE: Normal  - HVF 10-2: No plaquenil related defects  - mOCT: No plaquenil related defects  - FAF: No plaquenil related defects    Plan:  - Plaquenil is safe to use however would recommend decreasing dose or rechecking weight at next visit as dose is borderline and she reports gaining weight recently  - Next DFE: 1 year (06/2019)  - Repeat HVF, mOCT, and FAF: 5 years (around 2023)  - Risks of toxicity, proper dose levels, and the importance of regular annual screening were discussed with the patient    Reference:   AAO Update; Marmor, Otilio Carpen., et al. Recommendations on screening for chloroquine and hydroxychloroquine retinopathy (2016 Revision). Ophthalmology [phone].    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

## 2018-07-01 NOTE — Unmapped (Signed)
I discussed the findings, assessment, and plan with the resident and agree with the findings and plan as documented in the resident's note.

## 2018-07-04 ENCOUNTER — Other Ambulatory Visit: Payer: Self-pay

## 2018-07-04 ENCOUNTER — Encounter: Payer: Self-pay | Admitting: Emergency Medicine

## 2018-07-04 ENCOUNTER — Ambulatory Visit: Payer: Self-pay | Admitting: Emergency Medicine

## 2018-07-04 VITALS — BP 110/74 | HR 61 | Temp 99.0°F | Resp 14 | Ht <= 58 in | Wt 174.4 lb

## 2018-07-04 DIAGNOSIS — J111 Influenza due to unidentified influenza virus with other respiratory manifestations: Secondary | ICD-10-CM

## 2018-07-04 DIAGNOSIS — Z8739 Personal history of other diseases of the musculoskeletal system and connective tissue: Secondary | ICD-10-CM

## 2018-07-04 DIAGNOSIS — R6889 Other general symptoms and signs: Secondary | ICD-10-CM

## 2018-07-04 LAB — POCT INFLUENZA A/B
Influenza A, POC: POSITIVE — AB
Influenza B, POC: NEGATIVE

## 2018-07-04 MED ORDER — OSELTAMIVIR PHOSPHATE 75 MG PO CAPS: 75.0000 mg | ORAL_CAPSULE | Freq: Two times a day (BID) | ORAL | 0 refills | Status: DC

## 2018-07-04 MED ORDER — OSELTAMIVIR PHOSPHATE 75 MG PO CAPS: 75.0000 mg | ORAL_CAPSULE | Freq: Two times a day (BID) | ORAL | 0 refills | Status: AC

## 2018-07-04 NOTE — Patient Instructions (Addendum)
   If you have lab work done today you will be contacted with your lab results within the next 2 weeks.  If you have not heard from us then please contact us. The fastest way to get your results is to register for My Chart.   IF you received an x-ray today, you will receive an invoice from [facility]. Please contact [facility] at [phone] with questions or concerns regarding your invoice.   IF you received labwork today, you will receive an invoice from [facility]. Please contact [facility] at [phone] with questions or concerns regarding your invoice.   Our billing staff will not be able to assist you with questions regarding bills from these companies.  You will be contacted with the lab results as soon as they are available. The fastest way to get your results is to activate your My Chart account. Instructions are located on the last page of this paperwork. If you have not heard from us regarding the results in 2 weeks, please contact this office.     Gripe en los adultos Influenza, Adult A la gripe tambin se la conoce como "influenza". Es una infeccin en los pulmones, la nariz y la garganta (vas respiratorias). La causa un virus. La gripe provoca sntomas que son similares a los de un resfro. Tambin causa fiebre alta y dolores corporales. Se transmite fcilmente de persona a persona (es contagiosa). La mejor manera de prevenir la gripe es aplicndose la vacuna contra la gripe todos los aos. Cules son las causas? La causa de esta afeccin es el virus de la influenza. Puede contraer el virus de las siguientes maneras:  Respirar las gotitas que estn en el aire y que provienen de la tos o el estornudo de una persona que tiene el virus.  Tocar algo que tiene el virus (est contaminado) y luego tocarse la boca, la nariz o los ojos. Qu incrementa el riesgo? Hay ciertas cosas que lo pueden hacer ms propenso a tener gripe. Estas incluyen lo siguiente:  No  lavarse las manos con frecuencia.  Tener contacto cercano con muchas personas durante la temporada de resfro y gripe.  Tocarse la boca, los ojos o la nariz sin antes lavarse las manos.  No recibir la vacuna antigripal todos los aos. Puede correr un mayor riesgo de tener gripe, junto con problemas graves como una infeccin pulmonar (neumona), si:  Es mayor de 65 aos de edad.  Est embarazada.  Tiene debilitado el sistema que combate las defensas (sistema inmunitario) debido a una enfermedad o porque toma determinados medicamentos.  Tiene una enfermedad prolongada (crnica), por ejemplo: ? Enfermedad cardaca, renal o pulmonar. ? Diabetes. ? Asma.  Tiene un trastorno heptico.  Tiene mucho sobrepeso (obesidad mrbida).  Tiene anemia. Esta es una afeccin que afecta a los glbulos rojos. Cules son los signos o los sntomas? Los sntomas normalmente comienzan de repente y duran entre 4 y 14 das. Pueden incluir los siguientes:  Fiebre y escalofros.  Dolores de cabeza, dolores en el cuerpo o dolores musculares.  Dolor de garganta.  Tos.  Secrecin o congestin nasal.  Malestar en el pecho.  No desear comer en las cantidades normales (prdida del apetito).  Debilidad o cansancio (fatiga).  Mareos.  Malestar estomacal (nuseas) o ganas de devolver (vmitos). Cmo se trata? Si la gripe se encuentra de forma temprana, se la puede tratar con medicamentos que pueden ayudar a reducir la gravedad de la enfermedad y reducir su duracin (medicamentos antivirales). Estos pueden administrarse   por boca (va oral) o por va (catter) intravenosa. Cuidarse en su hogar puede ayudar a que mejoren los sntomas. El mdico puede sugerirle lo siguiente:  Tomar medicamentos de venta libre.  Beber mucho lquido. La gripe suele desaparecer sola. Si tiene sntomas muy graves u otros problemas, puede recibir tratamiento en un hospital. Siga estas indicaciones en su casa:      Actividad  Descanse todo lo que sea necesario. Duerma lo suficiente.  Qudese en su casa y no concurra al trabajo o a la escuela, como se lo haya indicado el mdico. ? No salga de su casa hasta que no haya tenido fiebre por 24horas sin tomar medicamentos. ? Salga de su casa solo para ir al mdico. Comida y bebida  Tome una SRO (solucin de rehidratacin oral). Es una bebida que se vende en farmacias y tiendas.  Beba suficiente lquido para mantener el pis (la orina) de color amarillo plido.  En la medida en que pueda, beba lquidos claros en pequeas cantidades. Los lquidos transparentes son, por ejemplo: ? Agua. ? Trocitos de hielo. ? Jugo de frutas con agua agregada (jugo de frutas diluido). ? Bebidas deportivas de bajas caloras.  En la medida en que pueda, consuma alimentos blandos y fciles de digerir en pequeas cantidades. Estos alimentos incluyen: ? Bananas. ? Pur de manzana. ? Arroz. ? Carnes magras. ? Tostadas. ? Galletas.  No coma ni beba lo siguiente: ? Lquidos con alto contenido de azcar o cafena. ? Alcohol. ? Alimentos condimentados o con alto contenido de grasa. Indicaciones generales  Tome los medicamentos de venta libre y los recetados solamente como se lo haya indicado el mdico.  Use un humidificador de aire fro para que el aire de su casa est ms hmedo. Esto puede facilitar la respiracin.  Al toser o estornudar, cbrase la boca y la nariz.  Lvese las manos con agua y jabn frecuentemente, en especial despus de toser o estornudar. Use desinfectante para manos con alcohol si no dispone de agua y jabn.  Concurra a todas las visitas de control como se lo haya indicado el mdico. Esto es importante. Cmo se evita?   Colquese la vacuna antigripal todos los aos. Puede colocarse la vacuna contra la gripe a fines de verano, en otoo o en invierno. Pregntele al mdico cundo debe aplicarse la vacuna contra la gripe.  Evite el contacto con  personas que estn enfermas durante el otoo y el invierno (la temporada de resfro y gripe). Comunquese con un mdico si:  Tiene sntomas nuevos.  Tiene los siguientes sntomas: ? Dolor en el pecho. ? Materia fecal lquida (diarrea). ? Fiebre.  La tos empeora.  Empieza a tener ms mucosidad.  Tiene malestar estomacal.  Vomita. Solicite ayuda inmediatamente si:  Le falta el aire.  Tiene dificultad para respirar.  La piel o las uas se ponen de un color azulado.  Presenta dolor muy intenso o rigidez en el cuello.  Tiene dolor de cabeza repentino.  Le duele la cara o el odo de forma repentina.  No puede comer ni beber sin vomitar. Resumen  La gripe es una infeccin en los pulmones, la nariz y la garganta. La causa un virus.  Tome los medicamentos de venta libre y los recetados solamente como se lo haya indicado el mdico.  Aplicarse la vacuna contra la gripe todos los aos es la mejor manera de evitar contagiarse la gripe. Esta informacin no tiene como fin reemplazar el consejo del mdico. Asegrese de hacerle al mdico   cualquier pregunta que tenga. Document Released: 09/01/2008 Document Revised: 01/16/2018 Document Reviewed: 01/16/2018 Elsevier Interactive Patient Education  2019 Elsevier Inc.  

## 2018-07-04 NOTE — Progress Notes (Signed)
Beverly Howe 48 y.o.   Chief Complaint  Patient presents with  . flu like symptoms    headache,running nose, coughing, fever, and chills x 2 day    HISTORY OF PRESENT ILLNESS: This is a 48 y.o. female complaining of flulike symptoms that started 3 days ago.  Complaining of headache, runny nose, coughing, fever and chills, with general achiness.  No other significant symptoms.  72-year-old daughter was also sick before her and is now doing better.  HPI   Prior to Admission medications   Medication Sig Start Date End Date Taking? Authorizing Provider  ibuprofen (ADVIL,MOTRIN) 600 MG tablet Take 1 tablet (600 mg total) by mouth every 6 (six) hours. 07/02/12  Yes [name], MD  methotrexate (50 MG/ML) 1 g injection Inject into the vein once.   Yes [provider]  Prenatal Vit-Fe Fumarate-FA (MULTIVITAMIN-PRENATAL) 27-0.8 MG TABS Take 1 tablet by mouth daily.   Yes [provider]  Prenatal Vit-Fe Fumarate-FA (PRENATAL MULTIVITAMIN) TABS Take 1 tablet by mouth every morning.   Yes [provider]  oseltamivir (TAMIFLU) 75 MG capsule Take 1 capsule (75 mg total) by mouth 2 (two) times daily for 5 days. 07/04/18 07/09/18  Georgina Quint, MD    Allergies  Allergen Reactions  . Caffeine Shortness Of Breath    There are no active problems to display for this patient.   Past Medical History:  Diagnosis Date  . Anemia   . Asthma   . Chest tightness   . Depression   . Gonorrhea   . Heart palpitations   . Hemorrhoids   . Kidney stones   . Morphea   . Varicella infection    in first pregnancy    Past Surgical History:  Procedure Laterality Date  . CESAREAN SECTION    . CESAREAN SECTION  06/30/2012   Procedure: CESAREAN SECTION;  Surgeon: Adam Phenix, MD;  Location: [facility];  Service: Obstetrics;  Laterality: N/A;  Repeat cesarean section of baby girl at 1934  APGAR 9/9    Social History   Socioeconomic History  . Marital  status: Married    Spouse name: Not on file  . Number of children: Not on file  . Years of education: Not on file  . Highest education level: Not on file  Occupational History  . Not on file  Social Needs  . Financial resource strain: Not on file  . Food insecurity:    Worry: Not on file    Inability: Not on file  . Transportation needs:    Medical: Not on file    Non-medical: Not on file  Tobacco Use  . Smoking status: Never Smoker  . Smokeless tobacco: Never Used  Substance and Sexual Activity  . Alcohol use: Yes    Comment: socially  . Drug use: No  . Sexual activity: Not Currently    Birth control/protection: None  Lifestyle  . Physical activity:    Days per week: Not on file    Minutes per session: Not on file  . Stress: Not on file  Relationships  . Social connections:    Talks on phone: Not on file    Gets together: Not on file    Attends religious service: Not on file    Active member of club or organization: Not on file    Attends meetings of clubs or organizations: Not on file    Relationship status: Not on file  . Intimate partner violence:  Fear of current or ex partner: Not on file    Emotionally abused: Not on file    Physically abused: Not on file    Forced sexual activity: Not on file  Other Topics Concern  . Not on file  Social History Narrative   ** Merged History Encounter **        Family History  Problem Relation Age of Onset  . Down syndrome Brother   . Hypertension Daughter   . Hypertension Mother   . Cancer Mother        Uterine   . Diabetes Maternal Aunt      Review of Systems  Constitutional: Positive for chills, fever and malaise/fatigue.  HENT: Negative for sore throat.   Eyes: Negative for blurred vision and double vision.  Respiratory: Positive for cough. Negative for shortness of breath.   Cardiovascular: Negative for chest pain and palpitations.  Gastrointestinal: Negative for abdominal pain, diarrhea, nausea and  vomiting.  Genitourinary: Negative.  Negative for dysuria and hematuria.  Musculoskeletal: Negative.  Negative for back pain, myalgias and neck pain.  Skin: Negative for rash.  Neurological: Positive for headaches. Negative for dizziness, sensory change and focal weakness.  Endo/Heme/Allergies: Negative.   All other systems reviewed and are negative.     Vitals:   07/04/18 1048  BP: 110/74  Pulse: 61  Resp: 14  Temp: 99 F (37.2 C)  SpO2: 96%    Physical Exam Vitals signs reviewed.  Constitutional:      Appearance: Normal appearance.  HENT:     Head: Normocephalic and atraumatic.     Nose: Nose normal.     Mouth/Throat:     Mouth: Mucous membranes are moist.     Pharynx: Oropharynx is clear.  Eyes:     Extraocular Movements: Extraocular movements intact.     Conjunctiva/sclera: Conjunctivae normal.     Pupils: Pupils are equal, round, and reactive to light.  Neck:     Musculoskeletal: Normal range of motion. No neck rigidity.  Cardiovascular:     Rate and Rhythm: Normal rate and regular rhythm.     Heart sounds: Normal heart sounds.  Pulmonary:     Effort: Pulmonary effort is normal.     Breath sounds: Normal breath sounds.  Musculoskeletal: Normal range of motion.  Lymphadenopathy:     Cervical: No cervical adenopathy.  Skin:    General: Skin is warm and dry.     Capillary Refill: Capillary refill takes less than 2 seconds.  Neurological:     General: No focal deficit present.     Mental Status: She is alert and oriented to person, place, and time.  Psychiatric:        Mood and Affect: Mood normal.        Behavior: Behavior normal.      ASSESSMENT & PLAN: Beverly Howe was seen today for flu like symptoms.  Diagnoses and all orders for this visit:  Influenza -     oseltamivir (TAMIFLU) 75 MG capsule; Take 1 capsule (75 mg total) by mouth 2 (two) times daily for 5 days.  Flu-like symptoms -     POCT Influenza A/B  History of rheumatoid  arthritis    Patient Instructions       If you have lab work done today you will be contacted with your lab results within the next 2 weeks.  If you have not heard from [place] then please contact us. The fastest way to get your results is to register  for My Chart.   IF you received an x-ray today, you will receive an invoice from [facility]. Please contact [facility] at [phone] with questions or concerns regarding your invoice.   IF you received labwork today, you will receive an invoice from [facility]. Please contact [facility] at [phone] with questions or concerns regarding your invoice.   Our billing staff will not be able to assist you with questions regarding bills from these companies.  You will be contacted with the lab results as soon as they are available. The fastest way to get your results is to activate your My Chart account. Instructions are located on the last page of this paperwork. If you have not heard from us regarding the results in 2 weeks, please contact this office.      Gripe en los adultos Influenza, Adult A la gripe tambin se la conoce como "influenza". Es una Advance Auto infeccin en los pulmones, la nariz y la garganta (vas respiratorias). La causa un virus. La gripe provoca sntomas que son similares a los de un resfro. Tambin causa fiebre alta y dolores corporales. Se transmite fcilmente de persona a persona (es contagiosa). La mejor manera de prevenir la gripe es aplicndose la vacuna contra la gripe todos los aos. Cules son las causas? La causa de esta afeccin es el virus de la influenza. Puede contraer el virus de las siguientes maneras:  Respirar las gotitas que estn en el aire y que provienen de la tos o el estornudo de una persona que tiene el virus.  Tocar algo que tiene el virus (est contaminado) y luego tocarse la boca, la nariz o los ojos. Qu incrementa el riesgo? Hay ciertas cosas que lo pueden hacer ms propenso a  Warden/rangertener gripe. Estas incluyen lo siguiente:  No lavarse las manos con frecuencia.  Tener contacto cercano con [place] personas durante la temporada de resfro y gripe.  Tocarse la boca, los ojos o la nariz sin antes lavarse las manos.  No recibir la Teachers Insurance and Annuity Associationvacuna antigripal todos los aos. Puede correr un mayor riesgo de tener gripe, junto con problemas graves como una infeccin pulmonar (neumona), si:  Es mayor de 65 aos de edad.  Est embarazada.  Tiene debilitado el sistema que combate las defensas (sistema inmunitario) debido a una enfermedad o porque toma determinados medicamentos.  Tiene una enfermedad prolongada (crnica), por ejemplo: ? Enfermedad cardaca, renal o pulmonar. ? Diabetes. ? Asma.  Tiene un trastorno heptico.  Tiene mucho sobrepeso (obesidad Barbadosmrbida).  Tiene anemia. Esta es una afeccin que afecta a los glbulos rojos. Cules son los signos o los sntomas? Los sntomas normalmente comienzan de repente y Armando Reichertduran entre 4 y [address]14 das. Pueden incluir los siguientes:  Grant RutsFiebre y escalofros.  Dolores de Sanduskycabeza, dolores en el cuerpo o dolores musculares.  Dolor de Advertising copywritergarganta.  Tos.  Secrecin o congestin nasal.  DentistMalestar en el pecho.  No desear comer en las cantidades normales (prdida del apetito).  Debilidad o cansancio (fatiga).  Mareos.  Malestar estomacal (nuseas) o ganas de devolver (vmitos). Cmo se trata? Si la gripe se encuentra de forma temprana, se la puede tratar con medicamentos que pueden ayudar a reducir la gravedad de la enfermedad y reducir su duracin (medicamentos antivirales). Estos pueden administrarse por boca (va oral) o por va (catter) intravenosa. Cuidarse en su hogar puede ayudar a que mejoren los sntomas. El mdico puede sugerirle lo siguiente:  Tomar medicamentos de Sales promotion account executiveventa libre.  Beber mucho lquido. La gripe suele desaparecer sola. Si tiene sntomas  muy graves u otros problemas, puede recibir tratamiento en un  hospital. Siga estas indicaciones en su casa:     Actividad  Descanse todo lo que sea necesario. Duerma lo suficiente.  Lanny HurstQudese en su casa y no concurra al Aleen Campitrabajo o a la escuela, como se lo haya indicado el mdico. ? No salga de su casa hasta que no haya tenido fiebre por 24horas sin tomar medicamentos. ? Salga de su casa solo para ir al American Expressmdico. Comida y bebida  Beverely Risenome una SRO (solucin de rehidratacin oral). Es Neomia Dearuna bebida que se vende en farmacias y tiendas.  Beba suficiente lquido para Radio producermantener el pis (la orina) de color amarillo plido.  En la medida en que pueda, beba lquidos claros en pequeas cantidades. Los lquidos transparentes son, por ejemplo: ? Westley Hummergua. ? Trocitos de hielo. ? Jugo de frutas con agua agregada (jugo de frutas diluido). ? Bebidas deportivas de bajas caloras.  En la medida en que pueda, consuma alimentos blandos y fciles de digerir en pequeas cantidades. Estos alimentos incluyen: ? Bananas. ? Pur de Praxairmanzana. ? Arroz. ? BJ'sCarnes magras. ? Tostadas. ? Galletas.  No coma ni beba lo siguiente: ? Lquidos con alto contenido de azcar o cafena. ? Alcohol. ? Alimentos condimentados o con alto contenido de [place]grasa. Indicaciones generales  Baxter Internationalome los medicamentos de venta libre y los recetados solamente como se lo haya indicado el mdico.  Use un humidificador de aire fro para que el aire de su casa est ms hmedo. Esto puede facilitar la respiracin.  Al toser o estornudar, cbrase la boca y la China Grovenariz.  Lvese las manos con agua y jabn frecuentemente, en especial despus de toser o Engineering geologistestornudar. Use desinfectante para manos con alcohol si no dispone de [place] y Bel[place]jabn.  Concurra a todas las visitas de control como se lo haya indicado el mdico. Esto es importante. Cmo se evita?   Colquese la vacuna antigripal todos los Judaaos. Puede colocarse la vacuna contra la gripe a fines de verano, en otoo o en invierno. Pregntele al mdico cundo debe aplicarse  la vacuna contra la gripe.  Evite el contacto con personas que estn enfermas durante el otoo y el invierno (la temporada de resfro y gripe). Comunquese con un mdico si:  Tiene sntomas nuevos.  Tiene los siguientes sntomas: ? Journalist, newspaperDolor en el pecho. ? Materia fecal lquida (diarrea). ? Fiebre.  La tos empeora.  Empieza a tener ms mucosidad.  Tiene Programme researcher, broadcasting/film/videomalestar estomacal.  Vomita. Solicite ayuda inmediatamente si:  Le falta el aire.  Tiene dificultad para respirar.  La piel o las uas se ponen de un color azulado.  Presenta dolor muy intenso o rigidez en el cuello.  Tiene dolor de cabeza repentino.  Le duele la cara o el odo de forma repentina.  No puede comer ni beber sin vomitar. Resumen  La gripe es una infeccin en los pulmones, la nariz y Administratorla garganta. La causa un virus.  Tome los medicamentos de venta libre y los recetados solamente como se lo haya indicado el mdico.  Aplicarse la vacuna contra la gripe todos los aos es la mejor manera de evitar contagiarse la gripe. Esta informacin no tiene Theme park managercomo fin reemplazar el consejo del mdico. Asegrese de hacerle al mdico cualquier pregunta que tenga. Document Released: 09/01/2008 Document Revised: 01/16/2018 Document Reviewed: 01/16/2018 Elsevier Interactive Patient Education  2019 Elsevier Inc.      Edwina BarthMiguel Jhaniya Briski, MD [facility]

## 2018-07-04 NOTE — Addendum Note (Signed)
Addended by: Georg Ruddle A on: 07/04/2018 11:51 AM   Modules accepted: Orders

## 2018-08-05 MED ORDER — TRIAMCINOLONE ACETONIDE 0.1 % TOPICAL OINTMENT
Freq: Two times a day (BID) | TOPICAL | 3 refills | 0.00000 days | Status: CP
Start: 2018-08-05 — End: 2019-08-05
  Filled 2018-08-12: qty 60, 30d supply, fill #0

## 2018-08-05 NOTE — Unmapped (Signed)
[facility] Refill and Clinical Coordination Note  Medication(s): methotrexate 50mg /64ml    Leslie Wade, DOB: September 21, 1970  Phone: [phone] (home) , Alternate phone contact: N/A  Shipping address: [address]  [place] [address]  Phone or address changes today?: No  All above HIPAA information verified.  Insurance changes? No    Completed refill and clinical call assessment today to schedule patient's medication shipment from the [facility] [phone]).      MEDICATION RECONCILIATION    Confirmed the medication and dosage are correct and have not changed: Yes, regimen is correct and unchanged.    Were there any changes to your medication(s) in the past month:  No, there are no changes reported at this time.    ADHERENCE    Is this medicine transplant or covered by Medicare Part B? No.    Did you miss any doses in the past 4 weeks? No missed doses reported.  Adherence counseling provided? Not needed     SIDE EFFECT MANAGEMENT    Are you tolerating your medication?:  Leslie Wade reports tolerating the medication.  Side effect management discussed: None      Therapy is appropriate and should be continued.    Evidence of clinical benefit: See Epic note from 02/14/18      FINANCIAL/SHIPPING    Delivery Scheduled: Yes, Expected medication delivery date: 08/13/2018     Medication will be delivered via UPS to the home address in [facility].    Additional medications refilled: syringes, hydroxychloroquine, folic acid, nystatin cream, hydrocortisone oint., triamcinolone oint.    The patient will receive a drug information handout for each medication shipped and additional FDA Medication Guides as required.      Leslie Wade did not have any additional questions at this time.    Delivery address confirmed in Epic.     We will follow up with patient monthly for standard refill processing and delivery.      Thank you,  Lupita Shutter   [facility] Specialty Pharmacist

## 2018-08-12 MED FILL — NYSTATIN 100,000 UNIT/GRAM TOPICAL CREAM: TOPICAL | 30 days supply | Qty: 30 | Fill #2

## 2018-08-12 MED FILL — FOLIC ACID 1 MG TABLET: 90 days supply | Qty: 180 | Fill #2 | Status: AC

## 2018-08-12 MED FILL — METHOTREXATE SODIUM 25 MG/ML INJECTION SOLUTION: 35 days supply | Qty: 4 | Fill #2 | Status: AC

## 2018-08-12 MED FILL — HYDROXYCHLOROQUINE 200 MG TABLET: 90 days supply | Qty: 180 | Fill #2 | Status: AC

## 2018-08-12 MED FILL — HYDROCORTISONE 2.5 % TOPICAL OINTMENT: 90 days supply | Qty: 85 | Fill #2 | Status: AC

## 2018-08-12 MED FILL — HYDROXYCHLOROQUINE 200 MG TABLET: ORAL | 90 days supply | Qty: 180 | Fill #2

## 2018-08-12 MED FILL — HYDROCORTISONE 2.5 % TOPICAL OINTMENT: TOPICAL | 90 days supply | Qty: 85.05 | Fill #2

## 2018-08-12 MED FILL — NYSTATIN 100,000 UNIT/GRAM TOPICAL CREAM: 30 days supply | Qty: 30 | Fill #2 | Status: AC

## 2018-08-12 MED FILL — BD TUBERCULIN SYRINGE 1 ML 27 X 1/2": SUBCUTANEOUS | 84 days supply | Qty: 12 | Fill #2

## 2018-08-12 MED FILL — TRIAMCINOLONE ACETONIDE 0.1 % TOPICAL OINTMENT: 30 days supply | Qty: 60 | Fill #0 | Status: AC

## 2018-08-12 MED FILL — FOLIC ACID 1 MG TABLET: ORAL | 90 days supply | Qty: 180 | Fill #2

## 2018-08-12 MED FILL — BD TUBERCULIN SYRINGE 1 ML 27 X 1/2": 84 days supply | Qty: 12 | Fill #2 | Status: AC

## 2018-08-12 MED FILL — METHOTREXATE SODIUM 25 MG/ML INJECTION SOLUTION: SUBCUTANEOUS | 35 days supply | Qty: 4 | Fill #2

## 2018-09-16 NOTE — Unmapped (Signed)
The [facility] has made a 2nd and final attempt to reach this patient to refill the following medication:methotrexate 25mg /ml.      We have Left voicemails on the following phone numbers: [phone], [phone].    Dates contacted: 03/23, 03/30  Last scheduled delivery: 2/24    The patient may be at risk of non-compliance with this medication. The patient should call the [facility] at [phone] (option 4) to refill medication.    Olga Millers   [facility] Specialty Technician

## 2018-10-30 ENCOUNTER — Ambulatory Visit: Admit: 2018-10-30 | Discharge: 2018-10-31 | Attending: Rheumatology | Primary: Rheumatology

## 2018-10-30 DIAGNOSIS — M05749 Rheumatoid arthritis with rheumatoid factor of unspecified hand without organ or systems involvement: Secondary | ICD-10-CM

## 2018-10-30 DIAGNOSIS — M05742 Rheumatoid arthritis with rheumatoid factor of left hand without organ or systems involvement: Secondary | ICD-10-CM

## 2018-10-30 DIAGNOSIS — L940 Localized scleroderma [morphea]: Principal | ICD-10-CM

## 2018-10-30 DIAGNOSIS — M05741 Rheumatoid arthritis with rheumatoid factor of right hand without organ or systems involvement: Secondary | ICD-10-CM

## 2018-10-30 MED ORDER — METHOTREXATE SODIUM 25 MG/ML INJECTION SOLUTION
SUBCUTANEOUS | 3 refills | 0.00000 days | Status: CP
Start: 2018-10-30 — End: 2018-11-13

## 2018-10-30 MED ORDER — SYRINGE WITH NEEDLE 1 ML 27 X 1/2"
SUBCUTANEOUS | 1 refills | 0.00000 days | Status: CP
Start: 2018-10-30 — End: 2018-11-21

## 2018-10-30 MED ORDER — FOLIC ACID 1 MG TABLET
ORAL_TABLET | Freq: Every day | ORAL | 3 refills | 0 days | Status: CP
Start: 2018-10-30 — End: 2018-11-21

## 2018-10-31 NOTE — Unmapped (Signed)
I spent 8 minutes on the phone with the patient. I spent an additional 6 minutes on pre- and post-visit activities.     The patient was physically located in [place] or a state in which I am permitted to provide care. The patient and/or parent/gauardian understood that s/he may incur co-pays and cost sharing, and agreed to the telemedicine visit. The visit was completed via phone and/or video, which was appropriate and reasonable under the circumstances given the patient's presentation at the time.    The patient and/or parent/guardian has been advised of the potential risks and limitations of this mode of treatment (including, but not limited to, the absence of in-person examination) and has agreed to be treated using telemedicine. The patient's/patient's family's questions regarding telemedicine have been answered.     If the phone/video visit was completed in an ambulatory setting, the patient and/or parent/guardian has also been advised to contact their provider???s office for worsening conditions, and seek emergency medical treatment and/or call 911 if the patient deems either necessary.        Cc: no joint pain.    HPI:    The patient is a 48 y.o. 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     Background history:   She also has a longstanding hx of morphea, dx by an outside dermatologist, path report scanned into media. +ANA 1:160, negative dsDNA, negative ENA.   She had neck lesions concerning for lymphadenopathy at her first visit. US revealed non-enlarged lymph nodes. 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  She has a hx of +TB skin test at age 13. 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     She is now only on MTX sq and decided to not take hydroxychloroquine out of concerns about potential ocular toxicity. She is doing very well and denied today any joint symptom including pain, stiffness or swelling. She also denied fever, chills, WL, LAD rashes, cough, dyspnea, chest pain, N/V/D or abdominal pain.  She has not been getting her MTX monitoring labs because of concerns about the pandemic.      No visits with results within 3 Month(s) from this visit.   Latest known visit with results is:   Office Visit on 02/14/2018   Component Date Value Ref Range Status   ??? Albumin 02/14/2018 4.3  3.5 - 5.0 g/dL Final   ??? ALT 16/03/9603 46  13 - 69 U/L Final   ??? AST 02/14/2018 38  17 - 47 U/L Final   ??? WBC 02/14/2018 10.3  4.5 - 11.0 10*9/L Final   ??? RBC 02/14/2018 4.60  4.00 - 5.20 10*12/L Final   ??? HGB 02/14/2018 12.7  12.0 - 16.0 g/dL Final   ??? HCT 54/02/8118 39.1  36.0 - 46.0 % Final   ??? MCV 02/14/2018 85.0  80.0 - 100.0 fL Final   ??? MCH 02/14/2018 27.5  26.0 - 34.0 pg Final   ??? MCHC 02/14/2018 32.4  31.0 - 37.0 g/dL Final   ??? RDW 14/78/2956 13.7  12.0 - 15.0 % Final   ??? MPV 02/14/2018 7.7  7.0 - 10.0 fL Final   ??? Platelet 02/14/2018 279  150 - 440 10*9/L Final   ??? Creatinine 02/14/2018 0.58* 0.60 - 1.00 mg/dL Final   ??? EGFR CKD-EPI Non-African American,* 02/14/2018 >90  >=60 mL/min/1.36m2 Final   ???  EGFR CKD-EPI African American, Fem* 02/14/2018 >90  >=60 mL/min/1.44m2 Final   ??? Vitamin D Total (25OH) 02/14/2018 16.1* 20.0 - 80.0 ng/mL Final   ??? CRP 02/14/2018 12.0* <10.0 mg/L Final     ROS:  all systems were reviewed and negative except as noted in the HPI.    Past Medical History:   Diagnosis Date   ??? Rheumatoid arthritis (CMS-HCC)    ??? TB lung, latent     Hx of positive PPD at age 75, treated for latent TB x 6 mo in 1999, completed therapy     Immunization History   Administered Date(s) Administered   ??? Influenza Vaccine Quad (IIV4 PF) 80mo+ injectable 06/02/2016   ??? PNEUMOCOCCAL POLYSACCHARIDE 23 08/31/2017   ??? Pneumococcal Conjugate 13-Valent 12/06/2016       MEDICATIONS:    Current Outpatient Medications:   ???  acetaminophen (TYLENOL) 500 MG tablet, Take 500 mg by mouth daily as needed for pain., Disp: , Rfl:   ???  betamethasone dipropionate (DIPROLENE) 0.05 % ointment, Apply topically Two (2) times a day. To spots, Disp: 45 g, Rfl: 3  ???  empty container (BD SHARPS COLLECTOR) Misc, Use as directed., Disp: 1 each, Rfl: 3  ???  etonogestrel (NEXPLANON) 68 mg Impl, 68 mg by Subdermal route once., Disp: , Rfl:   ???  folic acid (FOLVITE) 1 MG tablet, Take 2 tablets (2 mg total) by mouth daily., Disp: 180 tablet, Rfl: 3  ???  hydrocortisone 2.5 % ointment, Apply 1 application topically Two (2) times a day. To underneath breasts and inguinal folds for rash, itching, pain as needed, Disp: 60 g, Rfl: 5  ???  hydroxychloroquine (PLAQUENIL) 200 mg tablet, Take 1 tablet (200 mg total) by mouth Two (2) times a day., Disp: 180 tablet, Rfl: 3  ???  methotrexate 25 mg/mL injection solution, Inject 0.8 mL (20 mg total) under the skin every seven (7) days., Disp: 12 mL, Rfl: 1  ???  nystatin (MYCOSTATIN) 100,000 unit/gram cream, Apply 1 application topically Two (2) times a day., Disp: 30 g, Rfl: 5  ???  syringe with needle 1 mL 27 x 1/2 Syrg, Use as directed weekly for methotrexate injection, Disp: 50 each, Rfl: 0  ???  triamcinolone (KENALOG) 0.1 % ointment, Apply topically Two (2) times a day to spots on skin, Disp: 60 g, Rfl: 3  ???  triamcinolone (KENALOG) 0.5 % ointment, Apply topically Two (2) times a day. Large BSA, Disp: 60 g, Rfl: 2    IMPRESSION:    RA, stable.      PLAN:    I emphasized today the importance of obtaining MTX monitoring labs. She reassured me that she would come by the lab to update the labs.   Continue:    -     ALT; Standing  -     AST; Standing  -     CBC; Standing  -     Creatinine; Standing    Continue:  -     folic acid (FOLVITE) 1 MG tablet; Take 2 tablets (2 mg total) by mouth daily.  -     methotrexate 25 mg/mL injection solution; Inject 0.8 mL (20 mg total) under the skin every seven (7) days.  -     syringe with needle 1 mL 27 x 1/2 Syrg; Use as directed weekly for methotrexate injection    Sjogren's syndrome, with unspecified organ involvement (CMS-HCC) . Continue OTC products.       RTC in 5  months.

## 2018-11-08 NOTE — Unmapped (Signed)
The Methotrexate 25 mg/ mL preservative multi dose vial is backordered until mid June, [facility] asks if you would like to change the order?

## 2018-11-13 MED ORDER — METHOTREXATE SODIUM 25 MG/ML INJECTION SOLUTION
SUBCUTANEOUS | 6 refills | 0.00000 days | Status: CP
Start: 2018-11-13 — End: 2018-11-21

## 2018-11-21 MED ORDER — FOLIC ACID 1 MG TABLET
ORAL_TABLET | Freq: Every day | ORAL | 3 refills | 0 days | Status: CP
Start: 2018-11-21 — End: 2019-11-21
  Filled 2018-11-21: qty 180, 90d supply, fill #0

## 2018-11-21 MED ORDER — SYRINGE WITH NEEDLE 1 ML 27 X 1/2"
SUBCUTANEOUS | 1 refills | 0 days | Status: CP
Start: 2018-11-21 — End: ?

## 2018-11-21 MED ORDER — METHOTREXATE SODIUM 25 MG/ML INJECTION SOLUTION
SUBCUTANEOUS | 2 refills | 0.00000 days | Status: CP
Start: 2018-11-21 — End: ?
  Filled 2018-11-21: qty 10, 84d supply, fill #0

## 2018-11-21 MED FILL — FOLIC ACID 1 MG TABLET: 90 days supply | Qty: 180 | Fill #0 | Status: AC

## 2018-11-21 MED FILL — METHOTREXATE SODIUM 25 MG/ML INJECTION SOLUTION: 84 days supply | Qty: 10 | Fill #0 | Status: AC

## 2018-11-21 MED FILL — BD TUBERCULIN SYRINGE 1 ML 27 X 1/2": SUBCUTANEOUS | 28 days supply | Qty: 4 | Fill #0

## 2018-11-21 MED FILL — NYSTATIN 100,000 UNIT/GRAM TOPICAL CREAM: TOPICAL | 30 days supply | Qty: 30 | Fill #3

## 2018-11-21 MED FILL — TRIAMCINOLONE ACETONIDE 0.1 % TOPICAL OINTMENT: 30 days supply | Qty: 60 | Fill #1 | Status: AC

## 2018-11-21 MED FILL — BD TUBERCULIN SYRINGE 1 ML 27 X 1/2": 28 days supply | Qty: 4 | Fill #0 | Status: AC

## 2018-11-21 MED FILL — TRIAMCINOLONE ACETONIDE 0.1 % TOPICAL OINTMENT: TOPICAL | 30 days supply | Qty: 60 | Fill #1

## 2018-11-21 MED FILL — NYSTATIN 100,000 UNIT/GRAM TOPICAL CREAM: 30 days supply | Qty: 30 | Fill #3 | Status: AC

## 2018-11-21 NOTE — Unmapped (Signed)
[facility] Clinical Assessment & Refill Coordination Note    Leslie Wade, DOB: 04/01/1971  Phone: [phone] (home)     All above HIPAA information was verified with patient via [facility] Interpreter     Specialty Medication(s):   Inflammatory Disorders: methotrexate (injectable)     Current Outpatient Medications   Medication Sig Dispense Refill   ??? acetaminophen (TYLENOL) 500 MG tablet Take 500 mg by mouth daily as needed for pain.     ??? betamethasone dipropionate (DIPROLENE) 0.05 % ointment Apply topically Two (2) times a day. To spots 45 g 3   ??? empty container (BD SHARPS COLLECTOR) Misc Use as directed. 1 each 3   ??? etonogestrel (NEXPLANON) 68 mg Impl 68 mg by Subdermal route once.     ??? folic acid (FOLVITE) 1 MG tablet Take 2 tablets (2 mg total) by mouth daily. 180 tablet 3   ??? hydrocortisone 2.5 % ointment Apply 1 application topically Two (2) times a day. To underneath breasts and inguinal folds for rash, itching, pain as needed 60 g 5   ??? methotrexate 25 mg/mL injection solution Inject 0.8 mL (20 mg total) under the skin every seven (7) days. 8 mL 6   ??? nystatin (MYCOSTATIN) 100,000 unit/gram cream Apply 1 application topically Two (2) times a day. 30 g 5   ??? syringe with needle 1 mL 27 x 1/2 Syrg Use as directed weekly for methotrexate injection 50 each 1   ??? triamcinolone (KENALOG) 0.1 % ointment Apply topically Two (2) times a day to spots on skin 60 g 3   ??? triamcinolone (KENALOG) 0.5 % ointment Apply topically Two (2) times a day. Large BSA 60 g 2     No current facility-administered medications for this visit.         Changes to medications: Leslie Wade reports no changes at this time.    No Known Allergies    Changes to allergies: No    SPECIALTY MEDICATION ADHERENCE     Medication Adherence    Patient reported X missed doses in the last month:  all  Specialty Medication:  methotrexate   Patient is on additional specialty medications:  No  Informant: patient  Provider-estimated medication adherence level:  variable       Specialty medication(s) dose(s) confirmed: Regimen is correct and unchanged.     Are there any concerns with adherence? Yes: Patient has been out of methotrexate the past 1.5 months. She was under the assumption that pharmacy is closed during the pandemic and did not return phone call for refills.    Adherence counseling provided? Yes:  Advised patient to resume therapy as soon as possible - return pharmacy call when she receive a voicemail or nearing completion of her medication supply    CLINICAL MANAGEMENT AND INTERVENTION      Clinical Benefit Assessment:    Do you feel the medicine is effective or helping your condition? Yes    Clinical Benefit counseling provided? Not needed.  Patient did not experience any symptom/changes in her RA since off of methotrexate the past 1.5 months    Adverse Effects Assessment:    Are you experiencing any side effects? No    Are you experiencing difficulty administering your medicine? No    Quality of Life Assessment:    How many days over the past month did your methotrexate keep you from your normal activities? For example, brushing your teeth or getting up in the morning. 0  Have you discussed this with your provider? Not needed    Therapy Appropriateness:    Is therapy appropriate? Yes, therapy is appropriate and should be continued.  Patient will need to obtain monitoring labs within the next 3 months. She does not want to get them now due to the pandemic.  Labs are already ordered in the system by Dr. Olean Ree. Patient verbalized understanding.    DISEASE/MEDICATION-SPECIFIC INFORMATION      For patients on injectable medications: Patient currently has 0 doses left.  Next injection is scheduled for 11/22/18.    PATIENT SPECIFIC NEEDS     ? Does the patient have any physical, cognitive, or cultural barriers? No    ? Is the patient high risk? No     ? Does the patient require a Care Management Plan? No ? Does the patient require physician intervention or other additional services (i.e. nutrition, smoking cessation, social work)? No      SHIPPING     Specialty Medication(s) to be Shipped:   Inflammatory Disorders: methotrexate (injectable)    Other medication(s) to be shipped: syringes with needles, folic acid, hydrocortisone, triamcinolone, nystatin     Changes to insurance: No    Delivery Scheduled: Yes, Expected medication delivery date: 11/22/18.     Medication will be delivered via UPS to the confirmed home address in [facility]    The patient will receive a drug information handout for each medication shipped and additional FDA Medication Guides as required.  Verified that patient has previously received a Conservation officer, historic buildings.    All of the patient's questions and concerns have been addressed.    Leslie Wade   [facility] Specialty Pharmacist

## 2019-02-25 ENCOUNTER — Ambulatory Visit: Admit: 2019-02-25 | Discharge: 2019-02-26

## 2019-02-25 DIAGNOSIS — R7989 Other specified abnormal findings of blood chemistry: Secondary | ICD-10-CM

## 2019-02-25 DIAGNOSIS — M05741 Rheumatoid arthritis with rheumatoid factor of right hand without organ or systems involvement: Secondary | ICD-10-CM

## 2019-02-25 DIAGNOSIS — M05742 Rheumatoid arthritis with rheumatoid factor of left hand without organ or systems involvement: Secondary | ICD-10-CM

## 2019-02-25 LAB — ALT (SGPT): Alanine aminotransferase:CCnc:Pt:Ser/Plas:Qn:: 29

## 2019-02-25 LAB — CBC
HEMATOCRIT: 36.1 % (ref 36.0–46.0)
HEMOGLOBIN: 11.8 g/dL — ABNORMAL LOW (ref 12.0–16.0)
MEAN CORPUSCULAR HEMOGLOBIN CONC: 32.5 g/dL (ref 31.0–37.0)
MEAN CORPUSCULAR HEMOGLOBIN: 27.6 pg (ref 26.0–34.0)
MEAN CORPUSCULAR VOLUME: 84.7 fL (ref 80.0–100.0)
MEAN PLATELET VOLUME: 8.7 fL (ref 7.0–10.0)
PLATELET COUNT: 256 10*9/L (ref 150–440)
RED CELL DISTRIBUTION WIDTH: 14 % (ref 12.0–15.0)

## 2019-02-25 LAB — AST (SGOT): Aspartate aminotransferase:CCnc:Pt:Ser/Plas:Qn:: 28

## 2019-02-25 LAB — MEAN PLATELET VOLUME: Lab: 8.7

## 2019-02-25 LAB — EGFR CKD-EPI NON-AA FEMALE: Lab: >90

## 2019-02-25 LAB — CREATININE: EGFR CKD-EPI AA FEMALE: >90 mL/min/{1.73_m2} (ref >=60)

## 2019-02-25 LAB — ALBUMIN: Albumin:MCnc:Pt:Ser/Plas:Qn:: 3.7

## 2019-02-25 NOTE — Unmapped (Signed)
[facility] Refill Coordination Note    Specialty Medication(s) to be Shipped:   Inflammatory Disorders: methotrexate (injectable)    Other medication(s) to be shipped: n/a     Leslie Wade, DOB: 02-06-1971  Phone: [phone] (home)       All above HIPAA information was verified with patient.     Completed refill call assessment today to schedule patient's medication shipment from the [facility] [phone]).       Specialty medication(s) and dose(s) confirmed: Regimen is correct and unchanged.   Changes to medications: Leslie Wade reports no changes at this time.  Changes to insurance: No  Questions for the pharmacist: No    Confirmed patient received Welcome Packet with first shipment. The patient will receive a drug information handout for each medication shipped and additional FDA Medication Guides as required.       DISEASE/MEDICATION-SPECIFIC INFORMATION        For patients on injectable medications: Patient currently has 2 doses left.  Next injection is scheduled for 03/04/19.    SPECIALTY MEDICATION ADHERENCE     Medication Adherence    Patient reported X missed doses in the last month: 0  Specialty Medication: methotrexate  Patient is on additional specialty medications: No  Patient is on more than two specialty medications: No  Any gaps in refill history greater than 2 weeks in the last 3 months: no  Demonstrates understanding of importance of adherence: yes  Informant: patient                Methotrexate 25mg /ml: Patient has 14 days of medication on hand      SHIPPING     Shipping address confirmed in Epic.     Delivery Scheduled: Yes, Expected medication delivery date: 03/05/19.     Medication will be delivered via UPS to the home address in Epic [facility].    Leslie Wade   [facility] Specialty Technician

## 2019-02-27 LAB — VITAMIN D, TOTAL (25OH): Lab: 13 — ABNORMAL LOW

## 2019-02-27 LAB — VITAMIN D 25 HYDROXY: VITAMIN D, TOTAL (25OH): 13 ng/mL — ABNORMAL LOW (ref 20.0–80.0)

## 2019-03-04 MED FILL — METHOTREXATE SODIUM 25 MG/ML INJECTION SOLUTION: 84 days supply | Qty: 10 | Fill #1 | Status: AC

## 2019-03-04 MED FILL — METHOTREXATE SODIUM 25 MG/ML INJECTION SOLUTION: SUBCUTANEOUS | 84 days supply | Qty: 10 | Fill #1

## 2019-04-03 NOTE — Unmapped (Signed)
Patient has enough medication on hand, rescheduling refill call for 11/5

## 2019-04-15 NOTE — Unmapped (Signed)
IMPRESSION:  1. RA, with some persistent and mild activity in R wrist.  2. Low vitamin D  3. Nausea with methotrexate    PLAN:  ? Increase methotrexate to 25 mg [place] weekly. Continue to check methotrexate labs every 3 months.   -     Albumin; Standing  -     ALT; Standing  -     AST; Standing  -     CBC; Standing  -     Creatinine; Standing  ? Discontinue folic acid. Will start leucovorin 10 mg qweek the day after methotrexate.   ? Continue supplementation with vitamin D3 1000 IU daily.  ? She is up to date with vaccines.  ? Return in about 4 months (around 08/17/2019).     I am located onsite and the patient is located offsite for this visit.     I spent 11 minutes on the phone with the patient. I spent an additional 10 minutes on pre- and post-visit activities.     The patient was physically located in [place] or a state in which I am permitted to provide care. The patient and/or parent/guardian understood that s/he may incur co-pays and cost sharing, and agreed to the telemedicine visit. The visit was reasonable and appropriate under the circumstances given the patient's presentation at the time.    The patient and/or parent/guardian has been advised of the potential risks and limitations of this mode of treatment (including, but not limited to, the absence of in-person examination) and has agreed to be treated using telemedicine. The patient's/patient's family's questions regarding telemedicine have been answered.     If the visit was completed in an ambulatory setting, the patient and/or parent/guardian has also been advised to contact their provider???s office for worsening conditions, and seek emergency medical treatment and/or call 911 if the patient deems either necessary.    This note was entered by [place] Vo, acting as scribe for Annita Brod, MD.  Signature: CTV  Date: 04/16/19   Time: 12:10 PM I have reviewed the documentation provided by the scribe and confirm that it accurately reflects the service I personally performed and the decisions made by me.  Signature: Shenise Wolgamott C. Benito Mccreedy  Associate Professor of Medicine  [facility] [place]  ___________________________________________________________________    Chief Complaint: right wrist swelling    HPI:  Leslie Wade is a 48 y.o. 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 Other relevant labs include +ANA 1:160, negative dsDNA, negative ENA. Last clinic visit on 10/30/2018. She is now only on MTX [place] and decided to not take hydroxychloroquine out of concerns about potential ocular toxicity.    She also has a longstanding hx of morphea, dx by an outside dermatologist, path report scanned into media. She had neck lesions concerning for lymphadenopathy at her first visit. US revealed non-enlarged lymph nodes. 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 She has a hx of +TB skin test at age 59. 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     Today, patient presents reporting she is doing overall well in terms of the arthritis. She reports ongoing R wrist swelling. Endorses less than an hour of morning stiffness. Denies any other joint pain. She continues on methotrexate 20 mg [place] qweek. Reports headaches and  nausea the day she injects methotrexate. She self-discontinued hydroxychloroquine due to concern of potential ocular side effects.     Otherwise, denies fever, chills, fatigue, weight loss, cough, chest pain, dyspnea, nausea, vomiting, diarrhea, abdominal pain. She is UTD with the flu vaccine.    LABS/IMAGING:  Appointment on 02/25/2019   Component Date Value   ??? Vitamin D Total (25OH) 02/25/2019 13.0*   ??? Albumin 02/25/2019 3.7    ??? ALT 02/25/2019 29 ??? AST 02/25/2019 28    ??? WBC 02/25/2019 7.6    ??? RBC 02/25/2019 4.26    ??? HGB 02/25/2019 11.8*   ??? HCT 02/25/2019 36.1    ??? MCV 02/25/2019 84.7    ??? MCH 02/25/2019 27.6    ??? MCHC 02/25/2019 32.5    ??? RDW 02/25/2019 14.0    ??? MPV 02/25/2019 8.7    ??? Platelet 02/25/2019 256    ??? Creatinine 02/25/2019 0.58*   ??? EGFR CKD-EPI Non-African* 02/25/2019 >90    ??? EGFR CKD-EPI African Ame* 02/25/2019 >90        ROS: All systems were reviewed and negative except as noted in the HPI.    Past Medical History:   Diagnosis Date   ??? Rheumatoid arthritis (CMS-HCC)    ??? TB lung, latent     Hx of positive PPD at age 9, treated for latent TB x 6 mo in 1999, completed therapy     Immunization History   Administered Date(s) Administered   ??? Influenza Vaccine Quad (IIV4 PF) 47mo+ injectable 06/02/2016   ??? PNEUMOCOCCAL POLYSACCHARIDE 23 08/31/2017   ??? Pneumococcal Conjugate 13-Valent 12/06/2016       Current Outpatient Medications:   ???  acetaminophen (TYLENOL) 500 MG tablet, Take 500 mg by mouth daily as needed for pain., Disp: , Rfl:   ???  cholecalciferol, vitamin D3, (CHOLECALCIFEROL) 1,000 unit (25 mcg) tablet, Take 1 tablet (1,000 Units total) by mouth daily., Disp: 90 tablet, Rfl: 3  ???  empty container (BD SHARPS COLLECTOR) Misc, Use as directed., Disp: 1 each, Rfl: 3  ???  etonogestrel (NEXPLANON) 68 mg Impl, 68 mg by Subdermal route once., Disp: , Rfl:   ???  hydrocortisone 2.5 % ointment, Apply 1 application topically Two (2) times a day. To underneath breasts and inguinal folds for rash, itching, pain as needed, Disp: 60 g, Rfl: 5  ???  leucovorin (WELLCOVORIN) 10 MG tablet, Take 1 tablet (10 mg total) by mouth once a week for 7 days., Disp: 12 tablet, Rfl: 4  ???  methotrexate 25 mg/mL injection solution, Inject 1 mL (25 mg total) under the skin every seven (7) days., Disp: 12 mL, Rfl: 3  ???  nystatin (MYCOSTATIN) 100,000 unit/gram cream, Apply 1 application topically Two (2) times a day., Disp: 30 g, Rfl: 5 ???  syringe with needle 1 mL 27 x 1/2 Syrg, Use as directed weekly for methotrexate injection, Disp: 50 each, Rfl: 3  ???  triamcinolone (KENALOG) 0.1 % ointment, Apply topically Two (2) times a day to spots on skin, Disp: 60 g, Rfl: 3    PHYSICAL EXAM: Deferred at this time.    There were no vitals taken for this visit.

## 2019-04-16 ENCOUNTER — Institutional Professional Consult (permissible substitution): Admit: 2019-04-16 | Discharge: 2019-04-17 | Attending: Rheumatology | Primary: Rheumatology

## 2019-04-16 DIAGNOSIS — M05741 Rheumatoid arthritis with rheumatoid factor of right hand without organ or systems involvement: Principal | ICD-10-CM

## 2019-04-16 DIAGNOSIS — M3500 Sicca syndrome, unspecified: Principal | ICD-10-CM

## 2019-04-16 DIAGNOSIS — M05742 Rheumatoid arthritis with rheumatoid factor of left hand without organ or systems involvement: Secondary | ICD-10-CM

## 2019-04-16 DIAGNOSIS — M05749 Rheumatoid arthritis with rheumatoid factor of unspecified hand without organ or systems involvement: Principal | ICD-10-CM

## 2019-04-16 DIAGNOSIS — R7989 Other specified abnormal findings of blood chemistry: Principal | ICD-10-CM

## 2019-04-16 DIAGNOSIS — L940 Localized scleroderma [morphea]: Principal | ICD-10-CM

## 2019-04-16 MED ORDER — METHOTREXATE SODIUM 25 MG/ML INJECTION SOLUTION
SUBCUTANEOUS | 3 refills | 84 days | Status: CP
Start: 2019-04-16 — End: ?

## 2019-04-16 MED ORDER — LEUCOVORIN CALCIUM 10 MG TABLET
ORAL_TABLET | ORAL | 4 refills | 84 days | Status: CP
Start: 2019-04-16 — End: 2019-04-23
  Filled 2019-04-17: qty 8, 28d supply, fill #0

## 2019-04-16 MED ORDER — SYRINGE WITH NEEDLE 1 ML 27 X 1/2"
SUBCUTANEOUS | 3 refills | 350 days | Status: CP
Start: 2019-04-16 — End: ?

## 2019-04-16 MED ORDER — CHOLECALCIFEROL (VITAMIN D3) 25 MCG (1,000 UNIT) TABLET
ORAL_TABLET | Freq: Every day | ORAL | 3 refills | 90.00000 days | Status: CP
Start: 2019-04-16 — End: 2020-04-15
  Filled 2019-04-17: qty 90, 90d supply, fill #0

## 2019-04-17 MED FILL — LEUCOVORIN CALCIUM 5 MG TABLET: 28 days supply | Qty: 8 | Fill #0 | Status: AC

## 2019-04-17 MED FILL — CHOLECALCIFEROL (VITAMIN D3) 25 MCG (1,000 UNIT) TABLET: 90 days supply | Qty: 90 | Fill #0 | Status: AC

## 2019-04-17 NOTE — Unmapped (Signed)
Change in Methotrexate dosage increase. Refill too soon until 05/11/2019. Will attempt test claim on 05/12/2019 date.

## 2019-05-12 NOTE — Unmapped (Signed)
Change in Methotrexate dosage increase. Co-pay $0.00.

## 2019-05-19 NOTE — Unmapped (Signed)
[facility] Clinical Assessment & Refill Coordination Note    Leslie Wade, DOB: 11/18/1970  Phone: [phone] (home)     All above HIPAA information was verified with patient. Today's phone call was facilitated by a Spanish-speaking interpreter.    Specialty Medication(s):   Inflammatory Disorders: methotrexate (injectable)     Current Outpatient Medications   Medication Sig Dispense Refill   ??? acetaminophen (TYLENOL) 500 MG tablet Take 500 mg by mouth daily as needed for pain.     ??? cholecalciferol, vitamin D3, (CHOLECALCIFEROL) 1,000 unit (25 mcg) tablet Take 1 tablet (1,000 Units total) by mouth daily. 90 tablet 3   ??? empty container (BD SHARPS COLLECTOR) Misc Use as directed. 1 each 3   ??? etonogestrel (NEXPLANON) 68 mg Impl 68 mg by Subdermal route once.     ??? hydrocortisone 2.5 % ointment Apply 1 application topically Two (2) times a day. To underneath breasts and inguinal folds for rash, itching, pain as needed 60 g 5   ??? methotrexate 25 mg/mL injection solution Inject 1 mL (25 mg total) under the skin every seven (7) days. 12 mL 3   ??? nystatin (MYCOSTATIN) 100,000 unit/gram cream Apply 1 application topically Two (2) times a day. 30 g 5   ??? syringe with needle 1 mL 27 x 1/2 Syrg Use as directed weekly for methotrexate injection 50 each 3   ??? triamcinolone (KENALOG) 0.1 % ointment Apply topically Two (2) times a day to spots on skin 60 g 3     No current facility-administered medications for this visit.         Changes to medications: [place] reports no changes at this time.    No Known Allergies    Changes to allergies: No    SPECIALTY MEDICATION ADHERENCE     methotrexate 25mg /ml: 7 days of medicine on hand     Medication Adherence    Patient reported X missed doses in the last month: 0  Specialty Medication: methotrexate 25mg /ml Specialty medication(s) dose(s) confirmed: Patient reports changes to the regimen as follows: New dose is 1ml weekly - the patient is aware of this dose change and has been taking this new dose for several weeks now     Are there any concerns with adherence? No    Adherence counseling provided? Not needed    CLINICAL MANAGEMENT AND INTERVENTION      Clinical Benefit Assessment:    Do you feel the medicine is effective or helping your condition? Yes    Clinical Benefit counseling provided? Not needed    Adverse Effects Assessment:    Are you experiencing any side effects? No    Are you experiencing difficulty administering your medicine? No    Quality of Life Assessment:    How many days over the past month did your rheumatoid arthritis keep you from your normal activities? For example, brushing your teeth or getting up in the morning. 0    Have you discussed this with your provider? Not needed    Therapy Appropriateness:    Is therapy appropriate? Yes, therapy is appropriate and should be continued    DISEASE/MEDICATION-SPECIFIC INFORMATION      For patients on injectable medications: Patient currently has 1 doses left.  Next injection is scheduled for 05/23/2019.    PATIENT SPECIFIC NEEDS     ? Does the patient have any physical, cognitive, or cultural barriers? No    ? Is the patient high risk? No     ?  Does the patient require a Care Management Plan? No     ? Does the patient require physician intervention or other additional services (i.e. nutrition, smoking cessation, social work)? No      SHIPPING     Specialty Medication(s) to be Shipped:   Inflammatory Disorders: methotrexate 25mg /ml    Other medication(s) to be shipped: syringes, leucovorin     Changes to insurance: No    Delivery Scheduled: Yes, Expected medication delivery date: 05/27/2019.     Medication will be delivered via UPS to the confirmed prescription address in [employer] [place]. The patient will receive a drug information handout for each medication shipped and additional FDA Medication Guides as required.  Verified that patient has previously received a Conservation officer, historic buildings.    All of the patient's questions and concerns have been addressed.    Karene Fry Jakobee Brackins   [facility] Specialty Pharmacist

## 2019-05-26 MED FILL — BD TUBERCULIN SYRINGE 1 ML 27 X 1/2": SUBCUTANEOUS | 84 days supply | Qty: 12 | Fill #0

## 2019-05-26 MED FILL — LEUCOVORIN CALCIUM 5 MG TABLET: 28 days supply | Qty: 8 | Fill #1 | Status: AC

## 2019-05-26 MED FILL — LEUCOVORIN CALCIUM 5 MG TABLET: ORAL | 28 days supply | Qty: 8 | Fill #1

## 2019-05-26 MED FILL — METHOTREXATE SODIUM 25 MG/ML INJECTION SOLUTION: SUBCUTANEOUS | 84 days supply | Qty: 12 | Fill #0

## 2019-05-26 MED FILL — METHOTREXATE SODIUM 25 MG/ML INJECTION SOLUTION: 84 days supply | Qty: 12 | Fill #0 | Status: AC

## 2019-05-26 MED FILL — BD TUBERCULIN SYRINGE 1 ML 27 X 1/2": 84 days supply | Qty: 12 | Fill #0 | Status: AC

## 2019-08-18 NOTE — Unmapped (Signed)
The [facility] has made a third and final attempt to reach this patient to refill the following medication:methotrexate.      We have left voicemails on the following phone numbers: [phone].    Dates contacted: 2/17. 2/23, 3/1  Last scheduled delivery: 12/7    The patient may be at risk of non-compliance with this medication. The patient should call the [facility] at [phone] (option 4) to refill medication.    Olga Millers   [facility] Specialty Technician

## 2019-09-03 NOTE — Unmapped (Signed)
IMPRESSION:  1. RA, with stable activity in the right wrist.  2. Left breast pain and lack of PCP.  3. Health Maintenance  4. Nausea and headaches with MTX    PLAN:  ? After reviewing alternative medicines, we both agreed with stopping MTX and leucovorin. Will attempt a trial of leflunomide 20 mg daily. I counseled the patient on the need to monitor LFTs and CBC every 3 months, as well as maintaining contraception. The patient reported she is not sexually active.  ? Continue supplementation with vitamin D3 1000 IU daily.  ? Will refer to Internal Medicine to establish PCP and update mammogram.   ? I counseled her on getting the COVID-19 vaccination when available to her group.  ? Will update the following labs:  -     Albumin; Standing  -     ALT; Standing  -     AST; Standing  -     CBC; Standing  -     Creatinine; Standing  -     25 OH Vit D; Future  -     CRP  C-Reactive Protein; Future  ? Return in about 4 months (around 01/10/2020).       This note was entered by [place] Vo, acting as scribe for Annita Brod, MD.  Signature: CTV  Date: 09/10/19   Time: 11:03 AM    I have reviewed the documentation provided by the scribe and confirm that it accurately reflects the service I personally performed and the decisions made by me.  Signature: Dezarae Mcclaran C. Benito Mccreedy  Associate Professor of Medicine  [facility] of [place], [place]  ___________________________________________________________________    Chief Complaint: left breast pain    HPI:  Leslie Wade is a 49 y.o. 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 Other relevant labs include +ANA 1:160, negative dsDNA, negative ENA. Last clinic visit on 10/30/2018. She is now only on MTX [place] and decided to not take hydroxychloroquine out of concerns about potential ocular toxicity.  ??  She also has a longstanding hx of morphea, dx by an??outside dermatologist, path report scanned into media. She had neck lesions concerning for lymphadenopathy at her first visit. US revealed??non-enlarged lymph nodes.??Also had an abdominal mass that US revealed to be a lipoma. She has a hx of +TB skin test at age 22. 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    Last clinic visit on 04/16/2019.    Today, patient presents reporting six months of left sided chest pain that is deep in the level of the left breast. Sometimes her breast feels warm. She has not had a mammogram and does not have a PCP. In regards to her RA, she reports occasional pain in her wrist and hands, with occasional swelling on the right. Denies morning stiffness. She has some pain in her lower extremity joints, but she believes this is due to gaining weight. She rates her arthritis as 2/10. For the past two weeks, she did not inject MTX due to the side effects of headaches and nausea, despite taking leucovorin once a week, and due to taking care of her daughter who has been sick. She continues to have headaches and nausea currently. Endorses a rare non-productive cough. Endorses palpitations and food getting stuck in the lower esophagus. Otherwise, denies fever, chills, fatigue, weight loss,  chest pain, dyspnea, nausea, vomiting, diarrhea, abdominal pain.      LABS/IMAGING:  Appointment on 02/25/2019   Component Date Value   ??? Vitamin D Total (25OH) 02/25/2019 13.0*   ??? Albumin 02/25/2019 3.7    ??? ALT 02/25/2019 29    ??? AST 02/25/2019 28    ??? WBC 02/25/2019 7.6    ??? RBC 02/25/2019 4.26    ??? HGB 02/25/2019 11.8*   ??? HCT 02/25/2019 36.1    ??? MCV 02/25/2019 84.7    ??? MCH 02/25/2019 27.6    ??? MCHC 02/25/2019 32.5    ??? RDW 02/25/2019 14.0    ??? MPV 02/25/2019 8.7    ??? Platelet 02/25/2019 256    ??? Creatinine 02/25/2019 0.58*   ??? EGFR CKD-EPI Non-African* 02/25/2019 >90    ??? EGFR CKD-EPI African Ame* 02/25/2019 >90        ROS: All systems were reviewed and negative except as noted in the HPI.    Past Medical History:   Diagnosis Date   ??? Rheumatoid arthritis (CMS-HCC)    ??? TB lung, latent     Hx of positive PPD at age 22, treated for latent TB x 6 mo in 1999, completed therapy     Immunization History   Administered Date(s) Administered   ??? Influenza Vaccine Quad (IIV4 PF) 59mo+ injectable 06/02/2016   ??? Influenza Vaccine Quad (IIV4 PF)40MO-Adult 03/19/2019   ??? PNEUMOCOCCAL POLYSACCHARIDE 23 08/31/2017   ??? Pneumococcal Conjugate 13-Valent 12/06/2016       Current Outpatient Medications:   ???  acetaminophen (TYLENOL) 500 MG tablet, Take 500 mg by mouth daily as needed for pain., Disp: , Rfl:   ???  cholecalciferol, vitamin D3, (CHOLECALCIFEROL) 1,000 unit (25 mcg) tablet, Take 1 tablet (1,000 Units total) by mouth daily., Disp: 90 tablet, Rfl: 3  ???  empty container (BD SHARPS COLLECTOR) Misc, Use as directed., Disp: 1 each, Rfl: 3  ???  etonogestrel (NEXPLANON) 68 mg Impl, 68 mg by Subdermal route once., Disp: , Rfl:   ???  hydrocortisone 2.5 % ointment, Apply 1 application topically Two (2) times a day. To underneath breasts and inguinal folds for rash, itching, pain as needed, Disp: 60 g, Rfl: 5  ???  nystatin (MYCOSTATIN) 100,000 unit/gram cream, Apply 1 application topically Two (2) times a day., Disp: 30 g, Rfl: 5  ???  leflunomide (ARAVA) 20 MG tablet, Take 1 tablet (20 mg total) by mouth daily., Disp: 30 tablet, Rfl: 5    PHYSICAL EXAM:  BP 108/71 (BP Site: L Arm, BP Position: Sitting, BP Cuff Size: Large)  - Pulse 98  - Temp 36.3 ??C (97.4 ??F) (Temporal)  - Ht 147.3 cm (4' 9.99)  - Wt 84.2 kg (185 lb 9.6 oz)  - BMI 38.80 kg/m??     General:   WN, WD, NAD   Skin  No inflammatory or vasculitic lesions or subcutaneous nodules.    Eyes:   PERRL, conjunctiva and sclera not inflamed. Tears appear adequate.    ENT:   No oropharyngeal lesions. Mucous membranes moist.    Lymph:   No masses or cervical lymphadenopathy. No lower extremity edema.    Cardiovascular:  Regular rate and rhythm. No murmur, rub, or gallop.    Gastrointestinal :  Abdomen: soft, ND, without OM. Normal bowel sounds. Tenderness over LLQ.    Respiratory:  Clear to auscultation. Normal respiratory effort.    Musculoskeletal:   Diffuse tenderness in the left breast.   Hands: No  soft tissue swelling or tenderness at the small joints of the hands.   Wrists: No soft tissue swelling or tenderness at the left wrist. Swelling, tenderness, and restricted flexion and tenderness at right wrist.   Elbows: No soft tissue swelling or tenderness at the elbows. No nodules.  Shoulders: flexion normal, extension normal, abduction normal, external rotation normal, internal rotation normal.   Neck: flexion normal, extension normal, rotation normal.   Spine: No spinal tenderness.   Hips: ROM full in all planes. No tenderness over the greater trochanteric area bilaterally. Log roll test negative bilaterally.  Knees: No soft tissue swelling, tenderness, warmth, or effusions of knee.   Ankles: No tenderness or soft tissue swelling of the ankles.   Feet: Metatarsal squeeze test negative bilaterally.   Neurological:  CN 2-12 grossly intact. 5/5 strength on extremities.   Psych:  Appropriate affect and mood.

## 2019-09-10 ENCOUNTER — Ambulatory Visit: Admit: 2019-09-10 | Discharge: 2019-09-11 | Attending: Rheumatology | Primary: Rheumatology

## 2019-09-10 DIAGNOSIS — R7989 Other specified abnormal findings of blood chemistry: Principal | ICD-10-CM

## 2019-09-10 DIAGNOSIS — M05741 Rheumatoid arthritis with rheumatoid factor of right hand without organ or systems involvement: Principal | ICD-10-CM

## 2019-09-10 DIAGNOSIS — M05742 Rheumatoid arthritis with rheumatoid factor of left hand without organ or systems involvement: Principal | ICD-10-CM

## 2019-09-10 DIAGNOSIS — R110 Nausea: Principal | ICD-10-CM

## 2019-09-10 LAB — ALT (SGPT): Alanine aminotransferase:CCnc:Pt:Ser/Plas:Qn:: 17

## 2019-09-10 LAB — CBC
HEMATOCRIT: 38.2 % (ref 35.0–44.0)
HEMOGLOBIN: 12.4 g/dL (ref 12.0–15.5)
MEAN CORPUSCULAR HEMOGLOBIN CONC: 32.6 g/dL (ref 30.0–36.0)
MEAN CORPUSCULAR HEMOGLOBIN: 26.6 pg (ref 26.0–34.0)
MEAN PLATELET VOLUME: 8.5 fL (ref 7.0–10.0)
PLATELET COUNT: 286 10*9/L (ref 150–450)
RED BLOOD CELL COUNT: 4.68 10*12/L (ref 3.90–5.03)
RED CELL DISTRIBUTION WIDTH: 14.2 % (ref 12.0–15.0)
WBC ADJUSTED: 9.4 10*9/L (ref 3.5–10.5)

## 2019-09-10 LAB — ALBUMIN: Albumin:MCnc:Pt:Ser/Plas:Qn:: 3.9

## 2019-09-10 LAB — CREATININE: EGFR CKD-EPI NON-AA FEMALE: 122 mL/min/{1.73_m2}

## 2019-09-10 LAB — C-REACTIVE PROTEIN: C reactive protein:MCnc:Pt:Ser/Plas:Qn:: 16 — ABNORMAL HIGH

## 2019-09-10 LAB — AST (SGOT): Aspartate aminotransferase:CCnc:Pt:Ser/Plas:Qn:: 15

## 2019-09-10 LAB — EGFR CKD-EPI AA FEMALE: Glomerular filtration rate/1.73 sq M.predicted.black:ArVRat:Pt:Ser/Plas/Bld:Qn:Creatinine-based formula (CKD-EPI): 141

## 2019-09-10 LAB — MEAN PLATELET VOLUME: Platelet mean volume:EntVol:Pt:Bld:Qn:Automated count: 8.5

## 2019-09-10 MED ORDER — LEFLUNOMIDE 20 MG TABLET
ORAL_TABLET | Freq: Every day | ORAL | 5 refills | 30.00000 days | Status: CP
Start: 2019-09-10 — End: 2020-09-09
  Filled 2019-10-02: qty 30, 30d supply, fill #0

## 2019-09-10 NOTE — Unmapped (Signed)
Patient Education        leflunomide  Marca:  Arava  ??Cu??l es la informaci??n m??s importante que debo saber sobre leflunomide?  No use leflunomide si usted est?? embarazada, y deje de tomar esta medicina si usted cree que pueda estar embarazada. Use un m??todo de control de la natalidad para prevenir un embarazo mientras est?? tomando leflunomide, y Elaina Hoops que usted complete un procedimiento de eliminaci??n de la droga.  Leflunomide puede causar da??o del h??gado que es severo o mortal. D??gale a su m??dico si usted tiene un historial de enfermedad del h??gado, o si tambi??n [place] otras medicinas como: medicinas para el dolor o la artritis (incluyendo aspirin, Tylenol, y Advil/Motrin), medicinas para tratar la tuberculosis u otras infecciones, medicamento para las convulsiones, control de la natalidad hormonal o terapia del reemplazo hormonal, quimioterapia, medicina para reducir el colesterol, medicamento para el coraz??n, o medicina para la presi??n arterial.  La funci??n de su h??gado necesitar?? ser chequeada con frecuencia, y usted puede Ecologist de tomar leflunomide basado en los resultados de [address]  ??Qu?? es leflunomide?  Leflunomide afecta el sistema inmune y reduce la hinchaz??n e inflamaci??n en el cuerpo.  Leflunomide se [place] para el tratamiento de los s??ntomas de la artritis reumatoide.  Leflunomide puede tambi??n usarse para fines no mencionados en esta gu??a del medicamento.  ??Qu?? deber??a discutir con [employer] del cuidado de la salud antes de tomar leflunomide?  Usted no debe usar [employer] si es al??rgico a leflunomide o teriflunomide, o si:  ?? usted est?? embarazada (usted necesitar?? tener una prueba de embarazo negativa antes de comenzar este tratamiento;  ?? usted tiene enfermedad severa del h??gado; o  ?? usted tambi??n est?? usando teriflunomide.  No use leflunomide si usted est?? embarazada o puede quedar embarazada. Evite quedar embarazada hasta que usted pare de tomar leflunomide y se someta a un procedimiento de eliminaci??n de la droga para ayudar a su cuerpo a Dispensing optician. Deje de tomar leflunomide y llame a su m??dico de inmediato si usted no tiene un per??odo o cree que pueda estar embarazada.  Para asegurarse que leflunomide es seguro para usted, d??gale a su m??dico si usted tiene:  ?? un historial de enfermedad del h??gado o hepatitis (leflunomide puede causar problemas severos del h??gado);  ?? una infecci??n severa o no controlada;  ?? enfermedad del ri????n;  ?? problemas con nervios, como la neuropat??a causada por la diabetes;  ?? historial de tuberculosis;  ?? el sistema inmune d??bil o trastorno de la m??dula ??sea; o  ?? si usted est?? usando alguna droga que debilita el sistema inmune (como las medicinas contra el c??ncer o esteroides).  Use un m??todo de control de la natalidad para prevenir el embarazo mientras est?? tomando leflunomide. Despu??s de que usted deje de tomar leflunomide, contin??e usando un m??todo de control de la natalidad [facility] hagan pruebas de sangre para confirmar que el medicamento ha sido eliminado de su cuerpo.  Preg??ntele a su m??dico si usted [employer] un m??todo anticonceptivo de barrera (el cond??n o diafragma con espermicida). Usar la anticoncepci??n con hormonas (las pastillas anticonceptivas, inyecciones, implantes, parches de la piel, y anillos vaginales) puede aumentar su riesgo de da??o del h??gado mientras toma leflunomide.  No se sabe si leflunomide pasa a la leche materna o si le Archivist??o al beb?? lactante. Usted no debe amamantar mientras [place] esta medicina.  ??C??mo debo tomar leflunomide?  Antes de empezar el tratamiento con leflunomide, su  m??dico le puede hacer pruebas para confirmar que usted no tiene tuberculosis u otras infecciones.  Siga todas las instrucciones en la etiqueta de su prescripci??n. Tal vez su m??dico en ocasiones cambie su dosis. No use esta medicina en cantidades mayores o menores, o por m??s tiempo de lo recomendado.  Su presi??n arterial necesitar?? ser evaluada con frecuencia.  Leflunomide puede disminuir las c??lulas de la sangre que ayudan a su cuerpo a combatir infecciones y que ayudan a que su sangre coagule. Su sangre va a necesitar ser examinada con frecuencia. Puede ser necesario interrumpir su tratamiento con leflunomide por un corto tiempo basado en los [place] ex??menes.  La funci??n de su h??gado necesitar?? ser chequeada con frecuencia, y usted puede Ecologist de tomar leflunomide basado en los resultados de [address]  Despu??s de que usted deja de tomar leflunomide, usted puede necesitar ser tratado con otras medicinas para ayudar a su cuerpo a eliminar leflunomide m??s r??pido. Si usted no se somete a este procedimiento de eliminaci??n de la droga, leflunomide puede quedarse en su cuerpo hasta por 2 a??os. Siga las instrucciones de su m??dico.  Usted tambi??n tiene que pasar por este procedimiento de eliminaci??n de la droga si usted tiene planes de quedar embarazada despu??s que deje de tomar leflunomide.  La artritis reumatoide es con frecuencia tratada con Neomia Dear combinaci??n de drogas. Use todos sus medicamentos como le indique su m??dico. Lea la gu??a del medicamento o las instrucciones para el paciente que vienen con cada medicamento. No cambie la dosis o el horario de sus medicamentos sin el consejo de su m??dico.  Guarde a temperatura ambiente lejos de la humedad, Airline pilot, y Patent examiner.  ??Qu?? sucede si me salto una dosis?  Tome la dosis que dej?? de tomar tan pronto se acuerde. S??ltese la dosis que dej?? de tomar si ya casi es hora para la siguiente dosis. No use m??s medicina para alcanzar la dosis que dej?? de tomar.  ??Qu?? suceder??a en una sobredosis?  Busque atenci??n m??dica de emergencia o llame a la l??nea de Poison Help al [phone].  Los s??ntomas de sobredosis pueden incluir diarrea, dolor del est??mago, piel p??lida, moretones o sangrados con facilidad, [place], o ictericia (piel u ojos amarillos).  ??Qu?? debo evitar mientras uso leflunomide?  Evite estar cerca a personas que tienen resfriado, la gripe, u otras enfermedades contagiosas. Comun??quese de inmediato con su m??dico si usted desarrolla s??ntomas de infecci??n.  No reciba una vacuna viva mientras [place] leflunomide, o podr??a desarrollar una infecci??n grave. Las vacunas vivas incluyen sarampi??n, paperas, rub??ola (MMR), polio, rotavirus, tifoidea, fiebre amarilla, varicela, z??ster (culebrilla), y la vacuna nasal para la influenza.  ??Cu??les son los efectos secundarios posibles de leflunomide?  Busque atenci??n m??dica de emergencia si usted tiene s??ntomas de una reacci??n al??rgica: ronchas; dificultad para respirar; hinchaz??n de su cara, labios, lengua, o garganta.  Llame a su m??dico de inmediato si usted tiene:  ?? signos de infecci??n --debilidad o sensaci??n de enfermedad repentina, fiebre, escalofr??os, dolor de garganta, llagas en la boca, enc??as rojas o hinchadas, dificultad al tragar;  ?? dolor o malestar de pecho repentino, sibilancias, tos seca, sensaci??n de que le falta aire al respirar;  ?? moretones f??ciles, sangrado inusual (nariz, boca, vagina, o recto), manchas debajo de la piel en forma de puntos morados o rojos;  ?? entumecimiento, hormigueo, o dolor con sensaci??n de quemaz??n en sus manos o pies;  ?? problemas del h??gado --n??usea, dolor en la parte superior del est??mago, picaz??n, cansancio,  p??rdida del apetito, [place], heces de color arcilla, ictericia (color amarillo de la piel u ojos); o  ?? reacci??n severa de la piel --fiebre, dolor de garganta, hinchaz??n en su cara o lengua, quemaz??n en sus ojos, dolor de la piel seguido por un sarpullido rojo o p??rpura que se extiende (especialmente en la cara o la parte superior del cuerpo) y causa ampollas y descamaci??n.  Efectos secundarios comunes pueden incluir:  ?? n??usea, diarrea, dolor de est??mago;  ?? dolor de cabeza;  ?? pruebas anormales de la funci??n del h??gado;  ?? adelgazamiento del cabello;  ?? dolor de espalda;  ?? debilidad; ?? sarpullido; o  ?? presi??n arterial alta.  Esta lista no menciona todos los efectos secundarios y puede ser que ocurran otros. Llame a su m??dico para consejos m??dicos relacionados a efectos secundarios. Usted puede reportar efectos secundarios llamando al FDA al 1-800-FDA-1088.  ??Qu?? otras drogas afectar??n a leflunomide?  Leflunomide puede causar da??o del h??gado que es severo o mortal. Este efecto aumenta cuando usted tambi??n [place] ciertas otras medicinas, incluyendo:  ?? acetaminophen (Tylenol), aspirin, medicamento para la gota o la artritis (incluyendo inyecciones de oro); un AINE (antiinflamatorio no esteroide, NSAID por sus siglas en ingl??s) --ibuprofen (Advil, Motrin), naproxen (Aleve), celecoxib, diclofenac, indomethacin, meloxicam, y otros;  ?? un antibi??tico, medicina antif??ngica, o droga sulfa; medicina para tratar la tuberculosis; medicamento antiviral o para el VIH/SIDA; medicamento para las convulsiones --carbamazepine, phenytoin, valproic acid, y otros;  ?? pastillas anticonceptivas o terapia de reemplazo hormonal; esteroides anab??licos --methyltestosterone, drogas que mejoran el rendimiento; medicamentos para el c??ncer; o  ?? medicamento para reducir Print production planner --Crestor, Lipitor, Vytorin, Zocor, y otras; medicamento para el coraz??n o la presi??n arterial.  Esta lista no est?? completa y muchas otras drogas pueden interactuar con leflunomide. Esto incluye las medicinas que se obtienen con o sin receta, vitaminas, y productos herbarios. Dele una lista de todas sus medicinas a cualquier profesional de la salud que lo [place]. No todas las interacciones posibles aparecen en esta gu??a del medicamento.  ??D??nde puedo obtener m??s informaci??n?  Su farmac??utico le puede dar m??s informaci??n acerca de leflunomide.  Recuerde, mantenga ??sta y todas las otras medicinas fuera del alcance de los ni??os, no comparta nunca sus medicinas con otros, y use este medicamento s??lo para la condici??n por la que fue recetada.   Se ha hecho todo lo posible para que la informaci??n que proviene de [employer] ('[employer]') sea precisa, actual, y [place], pero no se hace garant??a de tal. La informaci??n sobre el medicamento incluida aqu?? puede tener nuevas recomendaciones. La informaci??n preparada por [employer] se ha creado para uso del profesional de la salud y para el consumidor en los [place] y por lo cual Mult[employer] no certifica que el uso fuera de los [place] sea apropiado, a menos que se mencione espec??ficamente lo cual. La informaci??n de [employer] sobre drogas no sanciona drogas, ni diagn??stica al paciente o recomienda terapia. La informaci??n de [employer] sobre drogas sirve como una fuente de informaci??n dise??ada para la [place] del profesional de la salud licenciado en el cuidado de sus pacientes y/o para servir al consumidor que reciba este servicio como un suplemento a, y no como sustituto de la competencia, experiencia, conocimiento y opini??n del profesional de [employer]. La ausencia en ??ste de una advertencia para Neomia Dear droga o combinaci??n de drogas no debe, de ninguna forma, interpretarse como que la droga o la combinaci??n de drogas sean seguras, efectivas, o apropiadas  para cualquier paciente. [employer] no se responsabiliza por ning??n aspecto del cuidado m??dico que reciba con la ayuda de la informaci??n que proviene de [employer]. La informaci??n incluida aqu?? no se ha creado con la intenci??n de cubrir [place], instrucciones, precauciones, advertencias, interacciones con otras drogas, reacciones al??rgicas, o efectos secundarios. Si usted tiene [place] pregunta acerca de las drogas que est?? tomando, consulte con su m??dico, enfermera, o farmac??utico.  Copyright [phone])[phone] [employer] Version: 9.01. Revision date: 07/05/2015.  Las instrucciones de cuidado fueron adaptadas bajo licencia por [employer]. Si usted tiene preguntas sobre una afecci??n m??dica o sobre estas instrucciones, siempre pregunte a su profesional de salud. [employer]wise[employer] Incorporated niega toda garant??a o responsabilidad por su uso de esta informaci??n.

## 2019-09-11 LAB — VITAMIN D, TOTAL (25OH): Lab: 23.4

## 2019-09-17 ENCOUNTER — Ambulatory Visit: Payer: Self-pay | Attending: Internal Medicine

## 2019-09-19 NOTE — Unmapped (Signed)
This patient has been disenrolled from the [facility] specialty pharmacy services due to specialty medication discontinuation.    Karene Fry Nashaly Dorantes  [facility] [employer] Specialty Pharmacist

## 2019-10-02 MED FILL — LEFLUNOMIDE 20 MG TABLET: 30 days supply | Qty: 30 | Fill #0 | Status: AC

## 2019-10-02 MED FILL — CHOLECALCIFEROL (VITAMIN D3) 25 MCG (1,000 UNIT) TABLET: 90 days supply | Qty: 90 | Fill #1 | Status: AC

## 2019-10-02 MED FILL — CHOLECALCIFEROL (VITAMIN D3) 25 MCG (1,000 UNIT) TABLET: ORAL | 90 days supply | Qty: 90 | Fill #1

## 2019-10-16 ENCOUNTER — Ambulatory Visit
Admit: 2019-10-16 | Discharge: 2019-10-17 | Attending: Student in an Organized Health Care Education/Training Program | Primary: Student in an Organized Health Care Education/Training Program

## 2019-10-16 DIAGNOSIS — M05742 Rheumatoid arthritis with rheumatoid factor of left hand without organ or systems involvement: Principal | ICD-10-CM

## 2019-10-16 DIAGNOSIS — M05741 Rheumatoid arthritis with rheumatoid factor of right hand without organ or systems involvement: Principal | ICD-10-CM

## 2019-10-16 DIAGNOSIS — R7989 Other specified abnormal findings of blood chemistry: Principal | ICD-10-CM

## 2019-10-16 DIAGNOSIS — Z131 Encounter for screening for diabetes mellitus: Principal | ICD-10-CM

## 2019-10-16 DIAGNOSIS — K649 Unspecified hemorrhoids: Principal | ICD-10-CM

## 2019-10-16 DIAGNOSIS — N644 Mastodynia: Principal | ICD-10-CM

## 2019-10-16 DIAGNOSIS — Z975 Presence of (intrauterine) contraceptive device: Principal | ICD-10-CM

## 2019-10-16 DIAGNOSIS — G4489 Other headache syndrome: Principal | ICD-10-CM

## 2019-10-16 DIAGNOSIS — Z0000 Encounter for general adult medical examination without abnormal findings: Principal | ICD-10-CM

## 2019-10-16 MED ORDER — HYDROCORTISONE 1 % TOPICAL CREAM
1 refills | 0 days | Status: CP
Start: 2019-10-16 — End: 2020-10-15

## 2019-10-17 DIAGNOSIS — N644 Mastodynia: Principal | ICD-10-CM

## 2020-01-14 ENCOUNTER — Ambulatory Visit: Admit: 2020-01-14 | Discharge: 2020-01-14

## 2020-01-14 ENCOUNTER — Ambulatory Visit: Admit: 2020-01-14 | Discharge: 2020-01-14 | Attending: Rheumatology | Primary: Rheumatology

## 2020-01-14 DIAGNOSIS — M05742 Rheumatoid arthritis with rheumatoid factor of left hand without organ or systems involvement: Secondary | ICD-10-CM

## 2020-01-14 DIAGNOSIS — M05741 Rheumatoid arthritis with rheumatoid factor of right hand without organ or systems involvement: Principal | ICD-10-CM

## 2020-01-14 MED ORDER — LEFLUNOMIDE 20 MG TABLET
ORAL_TABLET | Freq: Every day | ORAL | 5 refills | 30 days | Status: CP
Start: 2020-01-14 — End: 2021-01-13
  Filled 2020-03-02: qty 30, 30d supply, fill #0

## 2020-02-02 ENCOUNTER — Ambulatory Visit
Admit: 2020-02-02 | Discharge: 2020-02-03 | Attending: Student in an Organized Health Care Education/Training Program | Primary: Student in an Organized Health Care Education/Training Program

## 2020-02-02 DIAGNOSIS — M05741 Rheumatoid arthritis with rheumatoid factor of right hand without organ or systems involvement: Principal | ICD-10-CM

## 2020-02-02 DIAGNOSIS — M05742 Rheumatoid arthritis with rheumatoid factor of left hand without organ or systems involvement: Principal | ICD-10-CM

## 2020-02-02 DIAGNOSIS — R300 Dysuria: Principal | ICD-10-CM

## 2020-02-02 DIAGNOSIS — Z124 Encounter for screening for malignant neoplasm of cervix: Principal | ICD-10-CM

## 2020-02-02 DIAGNOSIS — D179 Benign lipomatous neoplasm, unspecified: Principal | ICD-10-CM

## 2020-02-02 DIAGNOSIS — K649 Unspecified hemorrhoids: Principal | ICD-10-CM

## 2020-02-02 DIAGNOSIS — Z975 Presence of (intrauterine) contraceptive device: Principal | ICD-10-CM

## 2020-02-02 MED ORDER — HYDROCORTISONE 1 % TOPICAL CREAM
TOPICAL | 1 refills | 0.00000 days | Status: CP
Start: 2020-02-02 — End: 2021-02-01
  Filled 2020-03-02: qty 28, 30d supply, fill #0

## 2020-02-04 ENCOUNTER — Ambulatory Visit: Admit: 2020-02-04 | Discharge: 2020-02-05

## 2020-02-04 DIAGNOSIS — N644 Mastodynia: Principal | ICD-10-CM

## 2020-02-05 DIAGNOSIS — R928 Other abnormal and inconclusive findings on diagnostic imaging of breast: Principal | ICD-10-CM

## 2020-02-06 ENCOUNTER — Ambulatory Visit: Admit: 2020-02-06

## 2020-03-02 MED FILL — HYDROCORTISONE 1 % TOPICAL CREAM: 30 days supply | Qty: 28 | Fill #0 | Status: AC

## 2020-03-02 MED FILL — CHOLECALCIFEROL (VITAMIN D3) 25 MCG (1,000 UNIT) TABLET: 90 days supply | Qty: 90 | Fill #2 | Status: AC

## 2020-03-02 MED FILL — LEFLUNOMIDE 20 MG TABLET: 30 days supply | Qty: 30 | Fill #0 | Status: AC

## 2020-03-02 MED FILL — CHOLECALCIFEROL (VITAMIN D3) 25 MCG (1,000 UNIT) TABLET: ORAL | 90 days supply | Qty: 90 | Fill #2

## 2020-03-12 ENCOUNTER — Ambulatory Visit
Admit: 2020-03-12 | Discharge: 2020-03-13 | Attending: Student in an Organized Health Care Education/Training Program | Primary: Student in an Organized Health Care Education/Training Program

## 2020-03-12 DIAGNOSIS — M05742 Rheumatoid arthritis with rheumatoid factor of left hand without organ or systems involvement: Principal | ICD-10-CM

## 2020-03-12 DIAGNOSIS — M05741 Rheumatoid arthritis with rheumatoid factor of right hand without organ or systems involvement: Principal | ICD-10-CM

## 2020-03-18 ENCOUNTER — Ambulatory Visit: Admit: 2020-03-18 | Discharge: 2020-03-19 | Attending: Obstetrics & Gynecology | Primary: Obstetrics & Gynecology

## 2020-03-18 DIAGNOSIS — Z3046 Encounter for surveillance of implantable subdermal contraceptive: Principal | ICD-10-CM

## 2020-03-18 DIAGNOSIS — Z975 Presence of (intrauterine) contraceptive device: Principal | ICD-10-CM

## 2020-04-19 MED FILL — LEFLUNOMIDE 20 MG TABLET: ORAL | 30 days supply | Qty: 30 | Fill #1

## 2020-04-19 MED FILL — LEFLUNOMIDE 20 MG TABLET: 30 days supply | Qty: 30 | Fill #1 | Status: AC

## 2020-04-29 ENCOUNTER — Ambulatory Visit
Admit: 2020-04-29 | Discharge: 2020-04-30 | Attending: Student in an Organized Health Care Education/Training Program | Primary: Student in an Organized Health Care Education/Training Program

## 2020-04-29 DIAGNOSIS — M05742 Rheumatoid arthritis with rheumatoid factor of left hand without organ or systems involvement: Principal | ICD-10-CM

## 2020-04-29 DIAGNOSIS — F32A Anxiety and depression: Principal | ICD-10-CM

## 2020-04-29 DIAGNOSIS — R7989 Other specified abnormal findings of blood chemistry: Principal | ICD-10-CM

## 2020-04-29 DIAGNOSIS — M05741 Rheumatoid arthritis with rheumatoid factor of right hand without organ or systems involvement: Principal | ICD-10-CM

## 2020-04-29 DIAGNOSIS — K649 Unspecified hemorrhoids: Principal | ICD-10-CM

## 2020-04-29 DIAGNOSIS — Z800 Family history of malignant neoplasm of digestive organs: Principal | ICD-10-CM

## 2020-04-29 DIAGNOSIS — F419 Anxiety disorder, unspecified: Principal | ICD-10-CM

## 2020-04-29 DIAGNOSIS — N63 Unspecified lump in unspecified breast: Principal | ICD-10-CM

## 2020-04-29 DIAGNOSIS — R1310 Dysphagia, unspecified: Principal | ICD-10-CM

## 2020-04-29 DIAGNOSIS — G44209 Tension-type headache, unspecified, not intractable: Principal | ICD-10-CM

## 2020-04-29 DIAGNOSIS — Z1211 Encounter for screening for malignant neoplasm of colon: Principal | ICD-10-CM

## 2020-04-29 MED ORDER — SERTRALINE 25 MG TABLET
ORAL_TABLET | Freq: Every day | ORAL | 6 refills | 30 days | Status: CP
Start: 2020-04-29 — End: 2021-04-29
  Filled 2020-05-04: qty 30, 30d supply, fill #0

## 2020-04-29 MED ORDER — LIDOCAINE 5 % TOPICAL OINTMENT
Freq: Every day | TOPICAL | 0 refills | 0.00000 days | Status: CP | PRN
Start: 2020-04-29 — End: 2021-04-29
  Filled 2020-05-04: qty 50, 30d supply, fill #0

## 2020-05-04 MED ORDER — PEG 3350-ELECTROLYTES 236 GRAM-22.74 GRAM-6.74 GRAM-5.86 GRAM SOLUTION
0 refills | 0 days | Status: CP
Start: 2020-05-04 — End: ?

## 2020-05-04 MED FILL — LIDOCAINE 5 % TOPICAL OINTMENT: 30 days supply | Qty: 50 | Fill #0 | Status: AC

## 2020-05-04 MED FILL — SERTRALINE 25 MG TABLET: 30 days supply | Qty: 30 | Fill #0 | Status: AC

## 2020-05-12 MED FILL — PEG 3350-ELECTROLYTES 236 GRAM-22.74 GRAM-6.74 GRAM-5.86 GRAM SOLUTION: 1 days supply | Qty: 4000 | Fill #0

## 2020-05-12 MED FILL — PEG 3350-ELECTROLYTES 236 GRAM-22.74 GRAM-6.74 GRAM-5.86 GRAM SOLUTION: 1 days supply | Qty: 4000 | Fill #0 | Status: AC

## 2020-05-21 MED ORDER — CHOLECALCIFEROL (VITAMIN D3) 250 MCG (10,000 UNIT) CAPSULE
ORAL_CAPSULE | ORAL | 0 refills | 42.00000 days | Status: CP
Start: 2020-05-21 — End: 2020-07-21
  Filled 2020-06-09: qty 30, 42d supply, fill #0

## 2020-05-24 MED FILL — LEFLUNOMIDE 20 MG TABLET: 30 days supply | Qty: 30 | Fill #2 | Status: AC

## 2020-05-24 MED FILL — LEFLUNOMIDE 20 MG TABLET: ORAL | 30 days supply | Qty: 30 | Fill #2

## 2020-05-27 ENCOUNTER — Ambulatory Visit
Admit: 2020-05-27 | Discharge: 2020-05-28 | Attending: Student in an Organized Health Care Education/Training Program | Primary: Student in an Organized Health Care Education/Training Program

## 2020-05-27 DIAGNOSIS — F32A Anxiety and depression: Principal | ICD-10-CM

## 2020-05-27 DIAGNOSIS — F419 Anxiety disorder, unspecified: Principal | ICD-10-CM

## 2020-06-09 MED FILL — CHOLECALCIFEROL (VITAMIN D3) 250 MCG (10,000 UNIT) CAPSULE: 42 days supply | Qty: 30 | Fill #0 | Status: AC

## 2020-06-09 MED FILL — SERTRALINE 25 MG TABLET: 30 days supply | Qty: 30 | Fill #1 | Status: AC

## 2020-06-09 MED FILL — SERTRALINE 25 MG TABLET: ORAL | 30 days supply | Qty: 30 | Fill #1

## 2020-06-22 MED FILL — LEFLUNOMIDE 20 MG TABLET: 30 days supply | Qty: 30 | Fill #3 | Status: AC

## 2020-06-22 MED FILL — LEFLUNOMIDE 20 MG TABLET: ORAL | 30 days supply | Qty: 30 | Fill #3

## 2020-07-21 MED FILL — SERTRALINE 25 MG TABLET: ORAL | 30 days supply | Qty: 30 | Fill #2

## 2020-07-22 ENCOUNTER — Ambulatory Visit: Admit: 2020-07-22 | Discharge: 2020-07-22 | Attending: Rheumatology | Primary: Rheumatology

## 2020-07-22 ENCOUNTER — Ambulatory Visit: Admit: 2020-07-22 | Discharge: 2020-07-22

## 2020-07-22 DIAGNOSIS — M05742 Rheumatoid arthritis with rheumatoid factor of left hand without organ or systems involvement: Principal | ICD-10-CM

## 2020-07-22 DIAGNOSIS — M05741 Rheumatoid arthritis with rheumatoid factor of right hand without organ or systems involvement: Principal | ICD-10-CM

## 2020-07-22 DIAGNOSIS — R042 Hemoptysis: Principal | ICD-10-CM

## 2020-07-22 DIAGNOSIS — M3500 Sicca syndrome, unspecified: Principal | ICD-10-CM

## 2020-07-22 MED ORDER — LEFLUNOMIDE 20 MG TABLET
ORAL_TABLET | Freq: Every day | ORAL | 5 refills | 30.00000 days | Status: CP
Start: 2020-07-22 — End: 2021-07-22
  Filled 2020-07-21: qty 30, 30d supply, fill #4
  Filled 2020-08-24: qty 30, 30d supply, fill #0

## 2020-07-29 ENCOUNTER — Ambulatory Visit: Admit: 2020-07-29 | Discharge: 2020-07-29

## 2020-07-29 DIAGNOSIS — R922 Inconclusive mammogram: Principal | ICD-10-CM

## 2020-07-29 DIAGNOSIS — R928 Other abnormal and inconclusive findings on diagnostic imaging of breast: Principal | ICD-10-CM

## 2020-07-29 DIAGNOSIS — N6001 Solitary cyst of right breast: Principal | ICD-10-CM

## 2020-08-24 MED FILL — SERTRALINE 25 MG TABLET: ORAL | 30 days supply | Qty: 30 | Fill #3

## 2020-09-22 MED FILL — LEFLUNOMIDE 20 MG TABLET: ORAL | 30 days supply | Qty: 30 | Fill #1

## 2020-09-24 MED FILL — SERTRALINE 25 MG TABLET: ORAL | 30 days supply | Qty: 30 | Fill #4

## 2020-10-19 ENCOUNTER — Ambulatory Visit: Admit: 2020-10-19

## 2020-10-22 MED FILL — SERTRALINE 25 MG TABLET: ORAL | 30 days supply | Qty: 30 | Fill #5

## 2020-10-22 MED FILL — LEFLUNOMIDE 20 MG TABLET: ORAL | 30 days supply | Qty: 30 | Fill #2

## 2020-11-26 ENCOUNTER — Encounter: Admit: 2020-11-26 | Discharge: 2020-11-26

## 2020-11-26 ENCOUNTER — Ambulatory Visit: Admit: 2020-11-26 | Discharge: 2020-11-26

## 2020-12-03 MED FILL — LEFLUNOMIDE 20 MG TABLET: ORAL | 30 days supply | Qty: 30 | Fill #3

## 2020-12-03 MED FILL — SERTRALINE 25 MG TABLET: ORAL | 30 days supply | Qty: 30 | Fill #6

## 2020-12-29 ENCOUNTER — Ambulatory Visit
Admit: 2020-12-29 | Discharge: 2020-12-30 | Attending: Student in an Organized Health Care Education/Training Program | Primary: Student in an Organized Health Care Education/Training Program

## 2020-12-29 DIAGNOSIS — K2970 Gastritis, unspecified, without bleeding: Principal | ICD-10-CM

## 2020-12-29 DIAGNOSIS — F419 Anxiety disorder, unspecified: Principal | ICD-10-CM

## 2020-12-29 DIAGNOSIS — R7989 Other specified abnormal findings of blood chemistry: Principal | ICD-10-CM

## 2020-12-29 DIAGNOSIS — Z6837 Body mass index (BMI) 37.0-37.9, adult: Principal | ICD-10-CM

## 2020-12-29 DIAGNOSIS — R079 Chest pain, unspecified: Principal | ICD-10-CM

## 2020-12-29 DIAGNOSIS — M05742 Rheumatoid arthritis with rheumatoid factor of left hand without organ or systems involvement: Principal | ICD-10-CM

## 2020-12-29 DIAGNOSIS — M05741 Rheumatoid arthritis with rheumatoid factor of right hand without organ or systems involvement: Principal | ICD-10-CM

## 2020-12-29 DIAGNOSIS — F32A Anxiety and depression: Principal | ICD-10-CM

## 2020-12-29 MED ORDER — SERTRALINE 50 MG TABLET
ORAL_TABLET | Freq: Every day | ORAL | 11 refills | 30.00000 days | Status: CP
Start: 2020-12-29 — End: ?
  Filled 2021-01-11: qty 30, 30d supply, fill #0

## 2021-01-11 MED FILL — LEFLUNOMIDE 20 MG TABLET: ORAL | 30 days supply | Qty: 30 | Fill #4

## 2021-01-19 ENCOUNTER — Ambulatory Visit: Admit: 2021-01-19 | Discharge: 2021-01-20 | Attending: Rheumatology | Primary: Rheumatology

## 2021-01-19 DIAGNOSIS — M05741 Rheumatoid arthritis with rheumatoid factor of right hand without organ or systems involvement: Principal | ICD-10-CM

## 2021-01-19 DIAGNOSIS — M05742 Rheumatoid arthritis with rheumatoid factor of left hand without organ or systems involvement: Principal | ICD-10-CM

## 2021-01-19 MED ORDER — LEFLUNOMIDE 20 MG TABLET
ORAL_TABLET | Freq: Every day | ORAL | 5 refills | 30.00000 days
Start: 2021-01-19 — End: 2022-01-19

## 2021-01-23 MED ORDER — HYDROXYCHLOROQUINE 200 MG TABLET
ORAL_TABLET | Freq: Two times a day (BID) | ORAL | 6 refills | 30 days | Status: CP
Start: 2021-01-23 — End: 2022-01-23
  Filled 2021-01-25: qty 60, 30d supply, fill #0

## 2021-02-17 MED FILL — SERTRALINE 50 MG TABLET: ORAL | 30 days supply | Qty: 30 | Fill #1

## 2021-02-17 MED FILL — LEFLUNOMIDE 20 MG TABLET: ORAL | 30 days supply | Qty: 30 | Fill #0

## 2021-02-18 MED FILL — HYDROXYCHLOROQUINE 200 MG TABLET: ORAL | 30 days supply | Qty: 60 | Fill #1

## 2021-03-30 ENCOUNTER — Ambulatory Visit: Admit: 2021-03-30 | Discharge: 2021-03-31

## 2021-03-31 ENCOUNTER — Ambulatory Visit
Admit: 2021-03-31 | Discharge: 2021-04-01 | Attending: Student in an Organized Health Care Education/Training Program | Primary: Student in an Organized Health Care Education/Training Program

## 2021-03-31 DIAGNOSIS — N760 Acute vaginitis: Principal | ICD-10-CM

## 2021-03-31 DIAGNOSIS — F32A Anxiety and depression: Principal | ICD-10-CM

## 2021-03-31 DIAGNOSIS — M05741 Rheumatoid arthritis with rheumatoid factor of right hand without organ or systems involvement: Principal | ICD-10-CM

## 2021-03-31 DIAGNOSIS — Z6834 Body mass index (BMI) 34.0-34.9, adult: Principal | ICD-10-CM

## 2021-03-31 DIAGNOSIS — B9689 Other specified bacterial agents as the cause of diseases classified elsewhere: Principal | ICD-10-CM

## 2021-03-31 DIAGNOSIS — M05742 Rheumatoid arthritis with rheumatoid factor of left hand without organ or systems involvement: Principal | ICD-10-CM

## 2021-03-31 DIAGNOSIS — Z9189 Other specified personal risk factors, not elsewhere classified: Principal | ICD-10-CM

## 2021-03-31 DIAGNOSIS — F419 Anxiety disorder, unspecified: Principal | ICD-10-CM

## 2021-03-31 MED ORDER — OZEMPIC 0.25 MG OR 0.5 MG (2 MG/1.5 ML) SUBCUTANEOUS PEN INJECTOR
SUBCUTANEOUS | 3 refills | 56.00000 days | Status: CN
Start: 2021-03-31 — End: ?

## 2021-03-31 MED ORDER — SERTRALINE 50 MG TABLET
ORAL_TABLET | Freq: Every day | ORAL | 3 refills | 90 days | Status: CP
Start: 2021-03-31 — End: ?
  Filled 2021-04-12: qty 90, 90d supply, fill #0

## 2021-03-31 MED ORDER — METRONIDAZOLE 500 MG TABLET
ORAL_TABLET | Freq: Two times a day (BID) | ORAL | 0 refills | 7 days | Status: CP
Start: 2021-03-31 — End: 2021-04-07
  Filled 2021-04-12: qty 14, 7d supply, fill #0

## 2021-03-31 MED ORDER — PEN NEEDLE, DIABETIC 29 GAUGE X 1/2" (12 MM)
11 refills | 0.00000 days | Status: CN
Start: 2021-03-31 — End: 2022-03-31

## 2021-04-07 ENCOUNTER — Ambulatory Visit
Admit: 2021-04-07 | Attending: Student in an Organized Health Care Education/Training Program | Primary: Student in an Organized Health Care Education/Training Program

## 2021-04-12 MED FILL — LEFLUNOMIDE 20 MG TABLET: ORAL | 30 days supply | Qty: 30 | Fill #1

## 2021-04-12 MED FILL — HYDROXYCHLOROQUINE 200 MG TABLET: ORAL | 30 days supply | Qty: 60 | Fill #2

## 2021-06-15 MED FILL — HYDROXYCHLOROQUINE 200 MG TABLET: ORAL | 30 days supply | Qty: 60 | Fill #3

## 2021-06-15 MED FILL — LEFLUNOMIDE 20 MG TABLET: ORAL | 30 days supply | Qty: 30 | Fill #2

## 2021-08-23 MED FILL — LEFLUNOMIDE 20 MG TABLET: ORAL | 30 days supply | Qty: 30 | Fill #3

## 2021-08-23 MED FILL — SERTRALINE 50 MG TABLET: ORAL | 90 days supply | Qty: 90 | Fill #1

## 2021-10-07 ENCOUNTER — Ambulatory Visit
Admit: 2021-10-07 | Discharge: 2021-10-08 | Attending: Student in an Organized Health Care Education/Training Program | Primary: Student in an Organized Health Care Education/Training Program

## 2021-10-07 DIAGNOSIS — F419 Anxiety disorder, unspecified: Principal | ICD-10-CM

## 2021-10-07 DIAGNOSIS — Z3189 Encounter for other procreative management: Principal | ICD-10-CM

## 2021-10-07 DIAGNOSIS — F32A Anxiety and depression: Principal | ICD-10-CM

## 2021-10-07 DIAGNOSIS — Z6834 Body mass index (BMI) 34.0-34.9, adult: Principal | ICD-10-CM

## 2021-10-07 DIAGNOSIS — R7989 Other specified abnormal findings of blood chemistry: Principal | ICD-10-CM

## 2021-10-07 DIAGNOSIS — M05742 Rheumatoid arthritis with rheumatoid factor of left hand without organ or systems involvement: Principal | ICD-10-CM

## 2021-10-07 DIAGNOSIS — M05741 Rheumatoid arthritis with rheumatoid factor of right hand without organ or systems involvement: Principal | ICD-10-CM

## 2021-10-07 DIAGNOSIS — G4700 Insomnia, unspecified: Principal | ICD-10-CM

## 2021-10-07 MED ORDER — METFORMIN ER 500 MG TABLET,EXTENDED RELEASE 24 HR
ORAL_TABLET | Freq: Two times a day (BID) | ORAL | 3 refills | 45 days | Status: CP
Start: 2021-10-07 — End: 2022-10-07
  Filled 2021-10-13: qty 90, 45d supply, fill #0

## 2021-10-07 MED ORDER — TRAZODONE 50 MG TABLET
ORAL_TABLET | Freq: Every evening | ORAL | 0 refills | 60 days | Status: CP | PRN
Start: 2021-10-07 — End: 2021-12-06
  Filled 2021-10-13: qty 30, 60d supply, fill #0

## 2021-10-13 MED FILL — LEFLUNOMIDE 20 MG TABLET: ORAL | 30 days supply | Qty: 30 | Fill #4

## 2021-10-13 MED FILL — HYDROXYCHLOROQUINE 200 MG TABLET: ORAL | 30 days supply | Qty: 60 | Fill #4

## 2021-11-10 ENCOUNTER — Ambulatory Visit: Admit: 2021-11-10 | Attending: Obstetrics & Gynecology | Primary: Obstetrics & Gynecology

## 2021-11-18 ENCOUNTER — Ambulatory Visit
Admit: 2021-11-18 | Discharge: 2021-11-19 | Attending: Student in an Organized Health Care Education/Training Program | Primary: Student in an Organized Health Care Education/Training Program

## 2021-11-18 DIAGNOSIS — K649 Unspecified hemorrhoids: Principal | ICD-10-CM

## 2021-11-18 DIAGNOSIS — Z30011 Encounter for initial prescription of contraceptive pills: Principal | ICD-10-CM

## 2021-11-18 DIAGNOSIS — N63 Unspecified lump in unspecified breast: Principal | ICD-10-CM

## 2021-11-18 MED ORDER — SERTRALINE 50 MG TABLET
ORAL_TABLET | Freq: Every day | ORAL | 3 refills | 45 days | Status: CP
Start: 2021-11-18 — End: 2021-11-18

## 2021-11-18 MED ORDER — SERTRALINE 25 MG TABLET
ORAL_TABLET | Freq: Every day | ORAL | 3 refills | 30 days | Status: CP
Start: 2021-11-18 — End: ?
  Filled 2021-11-22: qty 90, 30d supply, fill #0

## 2021-11-18 MED ORDER — LEVONORGESTREL-ETHINYL ESTRADIOL 0.1 MG-20 MCG TABLET
ORAL_TABLET | Freq: Every day | ORAL | 3 refills | 84 days | Status: CP
Start: 2021-11-18 — End: 2022-11-18
  Filled 2021-11-22: qty 84, 84d supply, fill #0

## 2021-11-18 MED ORDER — NORETHINDRONE ACETATE 0.5 MG-ETHINYL ESTRADIOL 2.5 MCG TABLET
ORAL_TABLET | Freq: Every day | ORAL | 11 refills | 30 days | Status: CN
Start: 2021-11-18 — End: 2022-11-18

## 2021-11-21 MED ORDER — TRAZODONE 50 MG TABLET
ORAL_TABLET | Freq: Every evening | ORAL | 3 refills | 60 days | Status: CP | PRN
Start: 2021-11-21 — End: 2022-01-20
  Filled 2021-11-29: qty 30, 60d supply, fill #0

## 2021-11-22 MED FILL — METFORMIN ER 500 MG TABLET,EXTENDED RELEASE 24 HR: ORAL | 90 days supply | Qty: 180 | Fill #1

## 2021-11-22 MED FILL — HYDROXYCHLOROQUINE 200 MG TABLET: ORAL | 60 days supply | Qty: 120 | Fill #5

## 2021-11-22 MED FILL — LEFLUNOMIDE 20 MG TABLET: ORAL | 30 days supply | Qty: 30 | Fill #5

## 2022-01-09 ENCOUNTER — Ambulatory Visit: Admit: 2022-01-09

## 2022-02-16 DIAGNOSIS — M05741 Rheumatoid arthritis with rheumatoid factor of right hand without organ or systems involvement: Principal | ICD-10-CM

## 2022-02-16 DIAGNOSIS — M05742 Rheumatoid arthritis with rheumatoid factor of left hand without organ or systems involvement: Principal | ICD-10-CM

## 2022-02-16 MED ORDER — LEFLUNOMIDE 20 MG TABLET
ORAL_TABLET | Freq: Every day | ORAL | 5 refills | 30 days | Status: CP
Start: 2022-02-16 — End: 2023-02-16
  Filled 2022-02-21: qty 90, 90d supply, fill #0

## 2022-02-16 MED ORDER — HYDROXYCHLOROQUINE 200 MG TABLET
ORAL_TABLET | Freq: Two times a day (BID) | ORAL | 6 refills | 30 days | Status: CP
Start: 2022-02-16 — End: 2023-02-16
  Filled 2022-02-21: qty 180, 90d supply, fill #0

## 2022-02-21 MED FILL — TRAZODONE 50 MG TABLET: ORAL | 60 days supply | Qty: 30 | Fill #1

## 2022-02-21 MED FILL — METFORMIN ER 500 MG TABLET,EXTENDED RELEASE 24 HR: ORAL | 45 days supply | Qty: 90 | Fill #2

## 2022-02-21 MED FILL — AVIANE 0.1 MG-20 MCG TABLET: ORAL | 84 days supply | Qty: 84 | Fill #1

## 2022-05-29 ENCOUNTER — Ambulatory Visit
Admit: 2022-05-29 | Discharge: 2022-05-30 | Attending: Student in an Organized Health Care Education/Training Program | Primary: Student in an Organized Health Care Education/Training Program

## 2022-05-29 DIAGNOSIS — L940 Localized scleroderma [morphea]: Principal | ICD-10-CM

## 2022-05-29 DIAGNOSIS — N949 Unspecified condition associated with female genital organs and menstrual cycle: Principal | ICD-10-CM

## 2022-05-29 DIAGNOSIS — N898 Other specified noninflammatory disorders of vagina: Principal | ICD-10-CM

## 2022-05-29 DIAGNOSIS — K649 Unspecified hemorrhoids: Principal | ICD-10-CM

## 2022-05-29 DIAGNOSIS — G4700 Insomnia, unspecified: Principal | ICD-10-CM

## 2022-05-29 DIAGNOSIS — N760 Acute vaginitis: Principal | ICD-10-CM

## 2022-05-29 DIAGNOSIS — Z6834 Body mass index (BMI) 34.0-34.9, adult: Principal | ICD-10-CM

## 2022-05-29 DIAGNOSIS — B9689 Other specified bacterial agents as the cause of diseases classified elsewhere: Principal | ICD-10-CM

## 2022-05-29 DIAGNOSIS — R300 Dysuria: Principal | ICD-10-CM

## 2022-05-29 DIAGNOSIS — N63 Unspecified lump in unspecified breast: Principal | ICD-10-CM

## 2022-05-29 MED ORDER — METFORMIN ER 500 MG TABLET,EXTENDED RELEASE 24 HR
ORAL_TABLET | Freq: Two times a day (BID) | ORAL | 3 refills | 23 days | Status: CP
Start: 2022-05-29 — End: 2023-05-29
  Filled 2022-06-06: qty 28, 7d supply, fill #0

## 2022-05-29 MED ORDER — TRAZODONE 50 MG TABLET
ORAL_TABLET | Freq: Every evening | ORAL | 3 refills | 10 days | Status: CP | PRN
Start: 2022-05-29 — End: 2022-07-28
  Filled 2022-06-06: qty 21, 7d supply, fill #0

## 2022-05-29 MED ORDER — METRONIDAZOLE 500 MG TABLET
ORAL_TABLET | Freq: Two times a day (BID) | ORAL | 0 refills | 7 days | Status: CP
Start: 2022-05-29 — End: 2022-06-05
  Filled 2022-06-06: qty 14, 7d supply, fill #0

## 2022-05-31 ENCOUNTER — Ambulatory Visit: Admit: 2022-05-31 | Attending: Rheumatology | Primary: Rheumatology

## 2022-07-04 DIAGNOSIS — L940 Localized scleroderma [morphea]: Principal | ICD-10-CM

## 2022-08-02 ENCOUNTER — Ambulatory Visit
Admit: 2022-08-02 | Discharge: 2022-08-03 | Attending: Student in an Organized Health Care Education/Training Program | Primary: Student in an Organized Health Care Education/Training Program

## 2022-08-02 DIAGNOSIS — L304 Erythema intertrigo: Principal | ICD-10-CM

## 2022-08-02 DIAGNOSIS — L940 Localized scleroderma [morphea]: Principal | ICD-10-CM

## 2022-08-02 MED ORDER — HYDROCORTISONE 2.5 % TOPICAL OINTMENT
Freq: Two times a day (BID) | TOPICAL | 4 refills | 0.00000 days | Status: CP
Start: 2022-08-02 — End: 2023-08-02
  Filled 2022-08-09: qty 56.7, 30d supply, fill #0

## 2022-08-02 MED ORDER — NYSTATIN 100,000 UNIT/GRAM TOPICAL POWDER
Freq: Every day | TOPICAL | 5 refills | 60.00000 days | Status: CP
Start: 2022-08-02 — End: ?
  Filled 2022-08-09: qty 30, 60d supply, fill #0

## 2022-08-09 MED FILL — TRAZODONE 50 MG TABLET: ORAL | 33 days supply | Qty: 99 | Fill #0

## 2022-08-09 MED FILL — AVIANE 0.1 MG-20 MCG TABLET: ORAL | 84 days supply | Qty: 84 | Fill #2

## 2022-08-09 MED FILL — METFORMIN ER 500 MG TABLET,EXTENDED RELEASE 24 HR: ORAL | 30 days supply | Qty: 120 | Fill #0

## 2022-08-09 MED FILL — LEFLUNOMIDE 20 MG TABLET: ORAL | 90 days supply | Qty: 90 | Fill #1

## 2022-08-09 MED FILL — HYDROXYCHLOROQUINE 200 MG TABLET: ORAL | 90 days supply | Qty: 180 | Fill #1

## 2022-08-09 MED FILL — SERTRALINE 25 MG TABLET: ORAL | 90 days supply | Qty: 270 | Fill #1

## 2022-08-16 ENCOUNTER — Ambulatory Visit: Admit: 2022-08-16 | Discharge: 2022-08-17 | Attending: Rheumatology | Primary: Rheumatology

## 2022-08-16 DIAGNOSIS — M25561 Pain in right knee: Principal | ICD-10-CM

## 2022-08-16 DIAGNOSIS — M05741 Rheumatoid arthritis with rheumatoid factor of right hand without organ or systems involvement: Principal | ICD-10-CM

## 2022-08-16 DIAGNOSIS — M05742 Rheumatoid arthritis with rheumatoid factor of left hand without organ or systems involvement: Principal | ICD-10-CM

## 2022-08-16 DIAGNOSIS — L940 Localized scleroderma [morphea]: Principal | ICD-10-CM

## 2022-08-16 DIAGNOSIS — M3500 Sicca syndrome, unspecified: Principal | ICD-10-CM

## 2022-08-16 DIAGNOSIS — R202 Paresthesia of skin: Principal | ICD-10-CM

## 2022-09-07 ENCOUNTER — Ambulatory Visit: Admit: 2022-09-07

## 2022-09-18 ENCOUNTER — Ambulatory Visit: Admit: 2022-09-18

## 2022-10-17 ENCOUNTER — Ambulatory Visit: Admit: 2022-10-17 | Discharge: 2022-10-18

## 2022-10-17 ENCOUNTER — Ambulatory Visit
Admit: 2022-10-17 | Discharge: 2022-10-18 | Attending: Rehabilitative and Restorative Service Providers" | Primary: Rehabilitative and Restorative Service Providers"

## 2022-10-17 MED ORDER — NAPROXEN 500 MG TABLET
ORAL_TABLET | Freq: Two times a day (BID) | ORAL | 1 refills | 30 days | Status: CP
Start: 2022-10-17 — End: ?
  Filled 2022-12-14: qty 60, 30d supply, fill #0

## 2022-10-25 DIAGNOSIS — M05742 Rheumatoid arthritis with rheumatoid factor of left hand without organ or systems involvement: Principal | ICD-10-CM

## 2022-10-25 DIAGNOSIS — M05741 Rheumatoid arthritis with rheumatoid factor of right hand without organ or systems involvement: Principal | ICD-10-CM

## 2022-10-25 MED ORDER — LEFLUNOMIDE 20 MG TABLET
ORAL_TABLET | Freq: Every day | ORAL | 5 refills | 30 days | Status: CP
Start: 2022-10-25 — End: 2023-10-25
  Filled 2022-10-27: qty 30, 30d supply, fill #0

## 2022-10-27 MED FILL — NYSTATIN 100,000 UNIT/GRAM TOPICAL POWDER: TOPICAL | 60 days supply | Qty: 30 | Fill #1

## 2022-10-27 MED FILL — HYDROCORTISONE 2.5 % TOPICAL OINTMENT: TOPICAL | 30 days supply | Qty: 56.7 | Fill #1

## 2022-11-07 ENCOUNTER — Ambulatory Visit: Admit: 2022-11-07 | Discharge: 2022-11-08

## 2022-11-07 DIAGNOSIS — K644 Residual hemorrhoidal skin tags: Principal | ICD-10-CM

## 2022-11-15 ENCOUNTER — Ambulatory Visit: Admit: 2022-11-15 | Discharge: 2022-11-16

## 2022-11-22 ENCOUNTER — Ambulatory Visit
Admit: 2022-11-22 | Discharge: 2022-11-23 | Attending: Student in an Organized Health Care Education/Training Program | Primary: Student in an Organized Health Care Education/Training Program

## 2022-11-22 DIAGNOSIS — L810 Postinflammatory hyperpigmentation: Principal | ICD-10-CM

## 2022-11-22 MED ORDER — HYDROQUINONE 4 % TOPICAL CREAM
Freq: Two times a day (BID) | TOPICAL | 4 refills | 0.00000 days | Status: CP
Start: 2022-11-22 — End: 2023-11-22

## 2022-12-03 ENCOUNTER — Ambulatory Visit: Admit: 2022-12-03 | Discharge: 2022-12-04

## 2022-12-04 DIAGNOSIS — M23341 Other meniscus derangements, anterior horn of lateral meniscus, right knee: Principal | ICD-10-CM

## 2022-12-12 ENCOUNTER — Ambulatory Visit: Admit: 2022-12-12 | Discharge: 2022-12-13

## 2022-12-12 DIAGNOSIS — K602 Anal fissure, unspecified: Principal | ICD-10-CM

## 2022-12-12 MED ORDER — LEVONORGESTREL-ETHINYL ESTRADIOL 0.1 MG-20 MCG TABLET
ORAL_TABLET | Freq: Every day | ORAL | 3 refills | 84 days | Status: CP
Start: 2022-12-12 — End: 2023-12-12
  Filled 2022-12-14: qty 84, 84d supply, fill #0

## 2022-12-12 MED ORDER — NIFED 0.3%/LIDOCA 1.5% W/PETRO
0 refills | 0 days | Status: CP
Start: 2022-12-12 — End: ?

## 2022-12-12 MED ORDER — SERTRALINE 25 MG TABLET
ORAL_TABLET | Freq: Every day | ORAL | 3 refills | 30 days | Status: CP
Start: 2022-12-12 — End: ?
  Filled 2022-12-14: qty 90, 30d supply, fill #0

## 2022-12-14 MED ORDER — NITROGLYCERIN 2 % TRANSDERMAL OINTMENT
Freq: Three times a day (TID) | TRANSDERMAL | 0 refills | 20 days | Status: CP
Start: 2022-12-14 — End: 2023-12-14

## 2022-12-14 MED FILL — HYDROCORTISONE 2.5 % TOPICAL OINTMENT: TOPICAL | 15 days supply | Qty: 28.35 | Fill #2

## 2022-12-14 MED FILL — HYDROXYCHLOROQUINE 200 MG TABLET: ORAL | 30 days supply | Qty: 60 | Fill #2

## 2022-12-14 MED FILL — METFORMIN ER 500 MG TABLET,EXTENDED RELEASE 24 HR: ORAL | 30 days supply | Qty: 120 | Fill #1

## 2022-12-14 MED FILL — NYSTATIN 100,000 UNIT/GRAM TOPICAL POWDER: TOPICAL | 60 days supply | Qty: 30 | Fill #2

## 2022-12-14 MED FILL — LEFLUNOMIDE 20 MG TABLET: ORAL | 30 days supply | Qty: 30 | Fill #1

## 2022-12-18 ENCOUNTER — Ambulatory Visit: Admit: 2022-12-18 | Discharge: 2022-12-19

## 2022-12-25 DIAGNOSIS — M23351 Other meniscus derangements, posterior horn of lateral meniscus, right knee: Principal | ICD-10-CM

## 2023-01-14 MED ORDER — HYDROCODONE 5 MG-ACETAMINOPHEN 325 MG TABLET
ORAL_TABLET | ORAL | 0 refills | 2 days | PRN
Start: 2023-01-14 — End: ?

## 2023-01-14 MED ORDER — GABAPENTIN 100 MG CAPSULE
ORAL_CAPSULE | Freq: Three times a day (TID) | ORAL | 0 refills | 5 days
Start: 2023-01-14 — End: 2023-01-19

## 2023-01-14 MED ORDER — POLYETHYLENE GLYCOL 3350 17 GRAM ORAL POWDER PACKET
PACK | Freq: Every day | ORAL | 0 refills | 30 days
Start: 2023-01-14 — End: 2023-02-13

## 2023-01-14 MED ORDER — ACETAMINOPHEN 500 MG TABLET
ORAL_TABLET | Freq: Three times a day (TID) | ORAL | 0 refills | 7 days
Start: 2023-01-14 — End: 2023-01-21

## 2023-01-14 MED ORDER — DOCUSATE SODIUM 100 MG CAPSULE
ORAL_CAPSULE | Freq: Two times a day (BID) | ORAL | 0 refills | 14 days
Start: 2023-01-14 — End: 2023-01-28

## 2023-01-14 MED ORDER — ASPIRIN 81 MG TABLET,DELAYED RELEASE
ORAL_TABLET | Freq: Every day | ORAL | 0 refills | 30 days
Start: 2023-01-14 — End: 2023-02-13

## 2023-01-14 MED ORDER — ONDANSETRON HCL 4 MG TABLET
ORAL_TABLET | Freq: Three times a day (TID) | ORAL | 0 refills | 10 days | PRN
Start: 2023-01-14 — End: 2023-01-28

## 2023-01-19 ENCOUNTER — Ambulatory Visit: Admit: 2023-01-19 | Discharge: 2023-01-20

## 2023-01-19 ENCOUNTER — Encounter: Admit: 2023-01-19 | Discharge: 2023-01-19 | Attending: Anesthesiology | Primary: Anesthesiology

## 2023-01-19 ENCOUNTER — Ambulatory Visit: Admit: 2023-01-19 | Discharge: 2023-01-19

## 2023-01-19 MED ORDER — HYDROCODONE 5 MG-ACETAMINOPHEN 325 MG TABLET
ORAL_TABLET | ORAL | 0 refills | 2.00000 days | Status: CP | PRN
Start: 2023-01-19 — End: 2023-01-19

## 2023-01-19 MED ORDER — ASPIRIN 81 MG TABLET,DELAYED RELEASE
ORAL_TABLET | Freq: Every day | ORAL | 0 refills | 30.00000 days | Status: CP
Start: 2023-01-19 — End: 2023-01-19

## 2023-01-19 MED ORDER — POLYETHYLENE GLYCOL 3350 17 GRAM ORAL POWDER PACKET
PACK | Freq: Every day | ORAL | 0 refills | 30.00000 days | Status: CP
Start: 2023-01-19 — End: 2023-01-19

## 2023-01-19 MED ORDER — DOCUSATE SODIUM 100 MG CAPSULE
ORAL_CAPSULE | Freq: Two times a day (BID) | ORAL | 0 refills | 14.00000 days | Status: CP
Start: 2023-01-19 — End: 2023-01-19

## 2023-01-19 MED ORDER — ACETAMINOPHEN 500 MG TABLET
ORAL_TABLET | Freq: Three times a day (TID) | ORAL | 0 refills | 7 days | Status: CP
Start: 2023-01-19 — End: 2023-01-26

## 2023-01-19 MED ORDER — GABAPENTIN 100 MG CAPSULE
ORAL_CAPSULE | Freq: Three times a day (TID) | ORAL | 0 refills | 5.00000 days | Status: CP
Start: 2023-01-19 — End: 2023-01-19

## 2023-01-19 MED ORDER — ONDANSETRON HCL 4 MG TABLET
ORAL_TABLET | Freq: Three times a day (TID) | ORAL | 0 refills | 10 days | Status: CP | PRN
Start: 2023-01-19 — End: 2023-02-02

## 2023-01-29 ENCOUNTER — Ambulatory Visit: Admit: 2023-01-29 | Discharge: 2023-01-30

## 2023-01-29 MED ORDER — HYDROCODONE 5 MG-ACETAMINOPHEN 325 MG TABLET
ORAL_TABLET | ORAL | 0 refills | 2 days | Status: CP | PRN
Start: 2023-01-29 — End: ?

## 2023-02-16 ENCOUNTER — Ambulatory Visit: Admit: 2023-02-16 | Discharge: 2023-02-17 | Attending: Rheumatology | Primary: Rheumatology

## 2023-02-16 DIAGNOSIS — Z79899 Other long term (current) drug therapy: Principal | ICD-10-CM

## 2023-02-16 DIAGNOSIS — M05741 Rheumatoid arthritis with rheumatoid factor of right hand without organ or systems involvement: Principal | ICD-10-CM

## 2023-02-16 DIAGNOSIS — S83206S Unspecified tear of unspecified meniscus, current injury, right knee, sequela: Principal | ICD-10-CM

## 2023-02-16 DIAGNOSIS — L940 Localized scleroderma [morphea]: Principal | ICD-10-CM

## 2023-02-16 DIAGNOSIS — R300 Dysuria: Principal | ICD-10-CM

## 2023-02-16 DIAGNOSIS — M05742 Rheumatoid arthritis with rheumatoid factor of left hand without organ or systems involvement: Principal | ICD-10-CM

## 2023-02-16 DIAGNOSIS — H9202 Otalgia, left ear: Principal | ICD-10-CM

## 2023-02-16 MED ORDER — HYDROXYCHLOROQUINE 200 MG TABLET
ORAL_TABLET | Freq: Every day | ORAL | 6 refills | 30 days | Status: CP
Start: 2023-02-16 — End: 2024-02-16
  Filled 2023-03-28: qty 30, 30d supply, fill #0

## 2023-02-16 MED ORDER — OFLOXACIN 0.3 % EAR DROPS
Freq: Every day | OTIC | 0 refills | 20 days | Status: CP
Start: 2023-02-16 — End: ?

## 2023-02-16 MED ORDER — LEFLUNOMIDE 20 MG TABLET
ORAL_TABLET | Freq: Every day | ORAL | 5 refills | 30 days | Status: CP
Start: 2023-02-16 — End: 2024-02-16
  Filled 2023-05-01: qty 90, 90d supply, fill #0

## 2023-03-02 ENCOUNTER — Ambulatory Visit
Admit: 2023-03-02 | Attending: Rehabilitative and Restorative Service Providers" | Primary: Rehabilitative and Restorative Service Providers"

## 2023-03-02 ENCOUNTER — Ambulatory Visit
Admit: 2023-03-02 | Discharge: 2023-03-31 | Attending: Rehabilitative and Restorative Service Providers" | Primary: Rehabilitative and Restorative Service Providers"

## 2023-03-05 ENCOUNTER — Ambulatory Visit: Admit: 2023-03-05 | Discharge: 2023-03-06

## 2023-03-05 DIAGNOSIS — M23351 Other meniscus derangements, posterior horn of lateral meniscus, right knee: Principal | ICD-10-CM

## 2023-03-05 MED ORDER — DICLOFENAC SODIUM 75 MG TABLET,DELAYED RELEASE
ORAL_TABLET | Freq: Two times a day (BID) | ORAL | 0 refills | 20 days | Status: CP
Start: 2023-03-05 — End: 2023-03-19

## 2023-03-26 ENCOUNTER — Ambulatory Visit: Admit: 2023-03-26 | Discharge: 2023-03-27

## 2023-03-26 DIAGNOSIS — N63 Unspecified lump in unspecified breast: Principal | ICD-10-CM

## 2023-03-26 DIAGNOSIS — H9202 Otalgia, left ear: Principal | ICD-10-CM

## 2023-03-26 MED ORDER — SERTRALINE 25 MG TABLET
ORAL_TABLET | Freq: Every day | ORAL | 3 refills | 30 days | Status: CP
Start: 2023-03-26 — End: ?
  Filled 2023-05-02: qty 90, 30d supply, fill #0

## 2023-03-26 MED ORDER — TRAZODONE 50 MG TABLET
ORAL_TABLET | Freq: Every evening | ORAL | 3 refills | 90 days | Status: CP | PRN
Start: 2023-03-26 — End: ?
  Filled 2023-05-01: qty 270, 90d supply, fill #0

## 2023-03-28 MED FILL — NAPROXEN 500 MG TABLET: ORAL | 30 days supply | Qty: 60 | Fill #1

## 2023-04-10 ENCOUNTER — Ambulatory Visit: Admit: 2023-04-10

## 2023-04-10 DIAGNOSIS — H9202 Otalgia, left ear: Principal | ICD-10-CM

## 2023-04-13 ENCOUNTER — Ambulatory Visit
Admit: 2023-04-13 | Attending: Rehabilitative and Restorative Service Providers" | Primary: Rehabilitative and Restorative Service Providers"

## 2023-04-17 ENCOUNTER — Ambulatory Visit
Admit: 2023-04-17 | Attending: Rehabilitative and Restorative Service Providers" | Primary: Rehabilitative and Restorative Service Providers"

## 2023-04-19 ENCOUNTER — Ambulatory Visit: Admit: 2023-04-19

## 2023-04-23 ENCOUNTER — Ambulatory Visit: Admit: 2023-04-23

## 2023-04-23 ENCOUNTER — Ambulatory Visit: Admit: 2023-04-23 | Attending: Audiologist | Primary: Audiologist

## 2023-04-24 ENCOUNTER — Ambulatory Visit
Admit: 2023-04-17 | Attending: Rehabilitative and Restorative Service Providers" | Primary: Rehabilitative and Restorative Service Providers"

## 2023-04-30 MED ORDER — SERTRALINE 25 MG TABLET
ORAL_TABLET | Freq: Every day | ORAL | 3 refills | 30 days
Start: 2023-04-30 — End: ?

## 2023-05-01 ENCOUNTER — Ambulatory Visit
Admit: 2023-05-01 | Attending: Rehabilitative and Restorative Service Providers" | Primary: Rehabilitative and Restorative Service Providers"

## 2023-05-01 MED ORDER — SERTRALINE 25 MG TABLET
ORAL_TABLET | Freq: Every day | ORAL | 3 refills | 30 days
Start: 2023-05-01 — End: ?

## 2023-05-01 MED FILL — HYDROXYCHLOROQUINE 200 MG TABLET: ORAL | 90 days supply | Qty: 90 | Fill #1

## 2023-05-29 ENCOUNTER — Ambulatory Visit: Admit: 2023-05-29

## 2023-07-16 ENCOUNTER — Ambulatory Visit: Admit: 2023-07-16

## 2023-09-12 ENCOUNTER — Ambulatory Visit: Admit: 2023-09-12 | Discharge: 2023-09-13 | Attending: Rheumatology | Primary: Rheumatology

## 2023-09-12 DIAGNOSIS — M05741 Rheumatoid arthritis with rheumatoid factor of right hand without organ or systems involvement: Principal | ICD-10-CM

## 2023-09-12 DIAGNOSIS — M3500 Sicca syndrome, unspecified: Principal | ICD-10-CM

## 2023-09-12 DIAGNOSIS — M05742 Rheumatoid arthritis with rheumatoid factor of left hand without organ or systems involvement: Principal | ICD-10-CM

## 2023-09-12 MED ORDER — CHOLESTYRAMINE (WITH SUGAR) 4 GRAM POWDER FOR SUSP IN A PACKET
Freq: Three times a day (TID) | ORAL | 0 refills | 11 days | Status: CP
Start: 2023-09-12 — End: 2023-09-23

## 2023-09-12 MED ORDER — LEFLUNOMIDE 20 MG TABLET
ORAL_TABLET | Freq: Every day | ORAL | 3 refills | 90 days | Status: CP
Start: 2023-09-12 — End: 2023-09-12

## 2023-09-12 MED ORDER — HYDROXYCHLOROQUINE 200 MG TABLET
ORAL_TABLET | Freq: Every day | ORAL | 3 refills | 90 days | Status: CP
Start: 2023-09-12 — End: 2024-09-11
  Filled 2023-11-19: qty 30, 30d supply, fill #0

## 2023-11-05 ENCOUNTER — Inpatient Hospital Stay: Admit: 2023-11-05 | Discharge: 2023-11-06

## 2023-11-05 ENCOUNTER — Ambulatory Visit: Admit: 2023-11-05 | Discharge: 2023-11-06

## 2023-11-05 DIAGNOSIS — M3500 Sicca syndrome, unspecified: Principal | ICD-10-CM

## 2023-11-05 DIAGNOSIS — W19XXXA Unspecified fall, initial encounter: Principal | ICD-10-CM

## 2023-11-05 DIAGNOSIS — M05742 Rheumatoid arthritis with rheumatoid factor of left hand without organ or systems involvement: Principal | ICD-10-CM

## 2023-11-05 DIAGNOSIS — M05741 Rheumatoid arthritis with rheumatoid factor of right hand without organ or systems involvement: Principal | ICD-10-CM

## 2023-11-05 DIAGNOSIS — M533 Sacrococcygeal disorders, not elsewhere classified: Principal | ICD-10-CM

## 2023-11-05 MED ORDER — LIDOCAINE 5 % TOPICAL PATCH
MEDICATED_PATCH | Freq: Two times a day (BID) | TRANSDERMAL | 0 refills | 5.00000 days | Status: CP
Start: 2023-11-05 — End: 2024-11-04
  Filled 2023-11-19: qty 10, 10d supply, fill #0

## 2023-11-19 MED FILL — TRAZODONE 50 MG TABLET: ORAL | 30 days supply | Qty: 90 | Fill #1

## 2023-12-04 DIAGNOSIS — M3500 Sicca syndrome, unspecified: Principal | ICD-10-CM

## 2023-12-04 DIAGNOSIS — M05741 Rheumatoid arthritis with rheumatoid factor of right hand without organ or systems involvement: Principal | ICD-10-CM

## 2023-12-04 DIAGNOSIS — R7989 Other specified abnormal findings of blood chemistry: Principal | ICD-10-CM

## 2023-12-04 DIAGNOSIS — M05742 Rheumatoid arthritis with rheumatoid factor of left hand without organ or systems involvement: Principal | ICD-10-CM

## 2023-12-04 MED ORDER — CHOLESTYRAMINE (WITH SUGAR) 4 GRAM POWDER FOR SUSP IN A PACKET
Freq: Three times a day (TID) | ORAL | 0 refills | 11.00000 days | Status: CP
Start: 2023-12-04 — End: 2023-12-15

## 2023-12-05 ENCOUNTER — Encounter: Admit: 2023-12-05 | Discharge: 2023-12-06

## 2023-12-05 ENCOUNTER — Inpatient Hospital Stay: Admit: 2023-12-05 | Discharge: 2023-12-06

## 2023-12-05 DIAGNOSIS — N898 Other specified noninflammatory disorders of vagina: Principal | ICD-10-CM

## 2023-12-05 DIAGNOSIS — Z124 Encounter for screening for malignant neoplasm of cervix: Principal | ICD-10-CM

## 2023-12-05 DIAGNOSIS — R300 Dysuria: Principal | ICD-10-CM

## 2023-12-05 DIAGNOSIS — L918 Other hypertrophic disorders of the skin: Principal | ICD-10-CM

## 2023-12-05 DIAGNOSIS — M533 Sacrococcygeal disorders, not elsewhere classified: Principal | ICD-10-CM

## 2023-12-06 MED ORDER — METRONIDAZOLE 0.75 % (37.5 MG/5 GRAM) VAGINAL GEL
Freq: Every day | VAGINAL | 0 refills | 5.00000 days | Status: CP
Start: 2023-12-06 — End: 2023-12-11

## 2024-03-05 ENCOUNTER — Ambulatory Visit: Admit: 2024-03-05 | Discharge: 2024-03-06 | Attending: Internal Medicine | Primary: Internal Medicine

## 2024-03-05 DIAGNOSIS — R7989 Other specified abnormal findings of blood chemistry: Principal | ICD-10-CM

## 2024-03-14 DIAGNOSIS — M05741 Rheumatoid arthritis with rheumatoid factor of right hand without organ or systems involvement: Principal | ICD-10-CM

## 2024-03-14 DIAGNOSIS — M3500 Sicca syndrome, unspecified: Principal | ICD-10-CM

## 2024-03-14 DIAGNOSIS — M05742 Rheumatoid arthritis with rheumatoid factor of left hand without organ or systems involvement: Principal | ICD-10-CM

## 2024-03-14 MED ORDER — CHOLESTYRAMINE (WITH SUGAR) 4 GRAM POWDER FOR SUSP IN A PACKET
Freq: Three times a day (TID) | ORAL | 0 refills | 11.00000 days
Start: 2024-03-14 — End: 2024-03-25

## 2024-03-17 MED ORDER — CHOLESTYRAMINE (WITH SUGAR) 4 GRAM POWDER FOR SUSP IN A PACKET
Freq: Three times a day (TID) | ORAL | 0 refills | 11.00000 days | Status: CP
Start: 2024-03-17 — End: 2024-03-28

## 2024-03-19 DIAGNOSIS — Z79899 Other long term (current) drug therapy: Principal | ICD-10-CM

## 2024-03-20 ENCOUNTER — Ambulatory Visit: Admit: 2024-03-20 | Discharge: 2024-03-20

## 2024-03-20 ENCOUNTER — Inpatient Hospital Stay: Admit: 2024-03-20 | Discharge: 2024-03-20

## 2024-03-20 ENCOUNTER — Encounter: Admit: 2024-03-20 | Discharge: 2024-03-20 | Attending: Internal Medicine | Primary: Internal Medicine

## 2024-03-20 DIAGNOSIS — K74 Liver fibrosis: Principal | ICD-10-CM

## 2024-03-20 DIAGNOSIS — M05742 Rheumatoid arthritis with rheumatoid factor of left hand without organ or systems involvement: Principal | ICD-10-CM

## 2024-03-20 DIAGNOSIS — M05741 Rheumatoid arthritis with rheumatoid factor of right hand without organ or systems involvement: Principal | ICD-10-CM

## 2024-03-20 DIAGNOSIS — R0781 Pleurodynia: Principal | ICD-10-CM

## 2024-03-20 DIAGNOSIS — R058 Dry cough: Principal | ICD-10-CM

## 2024-03-20 DIAGNOSIS — Z1231 Encounter for screening mammogram for malignant neoplasm of breast: Principal | ICD-10-CM

## 2024-04-25 DIAGNOSIS — R053 Chronic cough: Principal | ICD-10-CM

## 2024-04-25 MED ORDER — BENZONATATE 200 MG CAPSULE
ORAL_CAPSULE | Freq: Three times a day (TID) | ORAL | 0 refills | 7.00000 days | Status: CP | PRN
Start: 2024-04-25 — End: 2024-05-02

## 2024-04-25 MED ORDER — DEXTROMETHORPHAN-GUAIFENESIN 10 MG-100 MG/5 ML ORAL LIQUID
ORAL | 0 refills | 4.00000 days | Status: CP | PRN
Start: 2024-04-25 — End: ?

## 2024-04-25 MED ORDER — FLUTICASONE PROPIONATE 50 MCG/ACTUATION NASAL SPRAY,SUSPENSION
Freq: Every day | NASAL | 0 refills | 120.00000 days | Status: CP
Start: 2024-04-25 — End: ?

## 2024-04-25 MED ORDER — CETIRIZINE 10 MG TABLET
ORAL_TABLET | Freq: Every day | ORAL | 0 refills | 30.00000 days | Status: CP
Start: 2024-04-25 — End: ?

## 2024-05-14 ENCOUNTER — Encounter: Admit: 2024-05-14 | Discharge: 2024-05-15

## 2024-05-14 ENCOUNTER — Ambulatory Visit: Admit: 2024-05-14 | Discharge: 2024-05-15

## 2024-05-14 DIAGNOSIS — R300 Dysuria: Principal | ICD-10-CM

## 2024-05-14 MED ORDER — SULFAMETHOXAZOLE 800 MG-TRIMETHOPRIM 160 MG TABLET
ORAL_TABLET | Freq: Two times a day (BID) | ORAL | 0 refills | 5.00000 days | Status: CP
Start: 2024-05-14 — End: 2024-05-19

## 2024-05-14 MED ORDER — CIPROFLOXACIN 500 MG TABLET
ORAL_TABLET | Freq: Two times a day (BID) | ORAL | 0 refills | 7.00000 days | Status: CN
Start: 2024-05-14 — End: 2024-05-21

## 2024-06-20 ENCOUNTER — Ambulatory Visit: Admit: 2024-06-20 | Discharge: 2024-06-20 | Attending: Medical | Primary: Medical

## 2024-06-20 ENCOUNTER — Ambulatory Visit: Admit: 2024-06-20 | Discharge: 2024-06-20

## 2024-06-20 DIAGNOSIS — M059 Rheumatoid arthritis with rheumatoid factor, unspecified: Principal | ICD-10-CM

## 2024-06-20 DIAGNOSIS — R053 Chronic cough: Principal | ICD-10-CM

## 2024-06-20 DIAGNOSIS — Z5181 Encounter for therapeutic drug level monitoring: Principal | ICD-10-CM

## 2024-06-20 DIAGNOSIS — Z7969 Encounter for monitoring leflunomide therapy: Principal | ICD-10-CM

## 2024-07-01 ENCOUNTER — Ambulatory Visit: Admit: 2024-07-01 | Discharge: 2024-07-01 | Attending: Transplant Hepatology | Primary: Transplant Hepatology

## 2024-07-01 ENCOUNTER — Ambulatory Visit: Admit: 2024-07-01 | Discharge: 2024-07-01

## 2024-07-01 DIAGNOSIS — R7989 Other specified abnormal findings of blood chemistry: Principal | ICD-10-CM

## 2024-07-02 ENCOUNTER — Inpatient Hospital Stay: Admit: 2024-07-02 | Discharge: 2024-07-02

## 2024-07-05 DIAGNOSIS — Z1231 Encounter for screening mammogram for malignant neoplasm of breast: Principal | ICD-10-CM

## 2024-07-08 ENCOUNTER — Inpatient Hospital Stay: Admit: 2024-07-08 | Discharge: 2024-07-08

## 2024-07-08 DIAGNOSIS — R928 Other abnormal and inconclusive findings on diagnostic imaging of breast: Principal | ICD-10-CM

## 2024-07-08 DIAGNOSIS — R7989 Other specified abnormal findings of blood chemistry: Principal | ICD-10-CM

## 2024-07-09 ENCOUNTER — Emergency Department (HOSPITAL_COMMUNITY)
Admission: EM | Admit: 2024-07-09 | Discharge: 2024-07-10 | Discharge status: Against Medical Advice | Payer: Self-pay | Attending: Emergency Medicine | Admitting: Emergency Medicine

## 2024-07-09 ENCOUNTER — Other Ambulatory Visit: Payer: Self-pay

## 2024-07-09 DIAGNOSIS — Z5321 Procedure and treatment not carried out due to patient leaving prior to being seen by health care provider: Secondary | ICD-10-CM | POA: Insufficient documentation

## 2024-07-09 DIAGNOSIS — R109 Unspecified abdominal pain: Secondary | ICD-10-CM | POA: Insufficient documentation

## 2024-07-09 NOTE — ED Triage Notes (Signed)
 Patient reports pain at right abdomen onset yesterday unrelieved by RX medication , S/P Liver Biopsy procedure yesterday at [facility] .

## 2024-07-10 LAB — COMPREHENSIVE METABOLIC PANEL WITH GFR
ALT: 133 U/L — ABNORMAL HIGH (ref 0–44)
AST: 171 U/L — ABNORMAL HIGH (ref 15–41)
Albumin: 3.3 g/dL — ABNORMAL LOW (ref 3.5–5.0)
Alkaline Phosphatase: 113 U/L (ref 38–126)
Anion gap: 11 (ref 5–15)
BUN: 9 mg/dL (ref 6–20)
CO2: 23 mmol/L (ref 22–32)
Calcium: 8.6 mg/dL — ABNORMAL LOW (ref 8.9–10.3)
Chloride: 103 mmol/L (ref 98–111)
Creatinine, Ser: 0.53 mg/dL (ref 0.44–1.00)
GFR, Estimated: >60 mL/min
Glucose, Bld: 128 mg/dL — ABNORMAL HIGH (ref 70–99)
Potassium: 3.9 mmol/L (ref 3.5–5.1)
Sodium: 136 mmol/L (ref 135–145)
Total Bilirubin: 0.9 mg/dL (ref 0.0–1.2)
Total Protein: 7.4 g/dL (ref 6.5–8.1)

## 2024-07-10 LAB — CBC
HCT: 41.1 % (ref 36.0–46.0)
Hemoglobin: 13.4 g/dL (ref 12.0–15.0)
MCH: 29.4 pg (ref 26.0–34.0)
MCHC: 32.6 g/dL (ref 30.0–36.0)
MCV: 90.1 fL (ref 80.0–100.0)
Platelets: 265 K/uL (ref 150–400)
RBC: 4.56 MIL/uL (ref 3.87–5.11)
RDW: 14.2 % (ref 11.5–15.5)
WBC: 9.3 K/uL (ref 4.0–10.5)
nRBC: 0 % (ref 0.0–0.2)

## 2024-07-10 LAB — URINALYSIS, ROUTINE W REFLEX MICROSCOPIC
Bilirubin Urine: NEGATIVE
Glucose, UA: NEGATIVE mg/dL
Hgb urine dipstick: NEGATIVE
Ketones, ur: NEGATIVE mg/dL
Nitrite: NEGATIVE
Protein, ur: NEGATIVE mg/dL
Specific Gravity, Urine: 1.013 (ref 1.005–1.030)
pH: 5 (ref 5.0–8.0)

## 2024-07-10 LAB — LIPASE, BLOOD: Lipase: 32 U/L (ref 11–51)

## 2024-07-10 NOTE — ED Notes (Signed)
 Patient left.

## 2024-07-14 DIAGNOSIS — R0602 Shortness of breath: Principal | ICD-10-CM

## 2024-07-20 DIAGNOSIS — M059 Rheumatoid arthritis with rheumatoid factor, unspecified: Principal | ICD-10-CM
